# Patient Record
Sex: Female | Born: 1965 | Race: Black or African American | Hispanic: No | Marital: Married | Smoking: Never smoker
Health system: Southern US, Community
[De-identification: ages and names within clinical notes are randomized; demographics above are authoritative.]

## PROBLEM LIST (undated history)

## (undated) DIAGNOSIS — M1990 Unspecified osteoarthritis, unspecified site: Secondary | ICD-10-CM

## (undated) DIAGNOSIS — C189 Malignant neoplasm of colon, unspecified: Secondary | ICD-10-CM

## (undated) DIAGNOSIS — Z8719 Personal history of other diseases of the digestive system: Secondary | ICD-10-CM

## (undated) DIAGNOSIS — G4730 Sleep apnea, unspecified: Secondary | ICD-10-CM

## (undated) DIAGNOSIS — G5600 Carpal tunnel syndrome, unspecified upper limb: Secondary | ICD-10-CM

## (undated) DIAGNOSIS — I499 Cardiac arrhythmia, unspecified: Secondary | ICD-10-CM

## (undated) DIAGNOSIS — K219 Gastro-esophageal reflux disease without esophagitis: Secondary | ICD-10-CM

## (undated) DIAGNOSIS — IMO0002 Reserved for concepts with insufficient information to code with codable children: Secondary | ICD-10-CM

## (undated) DIAGNOSIS — I10 Essential (primary) hypertension: Secondary | ICD-10-CM

## (undated) DIAGNOSIS — I480 Paroxysmal atrial fibrillation: Secondary | ICD-10-CM

## (undated) HISTORY — PX: LASIK: SHX215

## (undated) HISTORY — PX: BACK SURGERY: SHX140

## (undated) HISTORY — DX: Sleep apnea, unspecified: G47.30

---
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
## 2001-04-06 ENCOUNTER — Encounter: Admission: RE | Admit: 2001-04-06 | Discharge: 2001-04-06 | Payer: Self-pay | Admitting: Orthopedic Surgery

## 2001-04-06 ENCOUNTER — Encounter: Payer: Self-pay | Admitting: Orthopedic Surgery

## 2002-03-30 DIAGNOSIS — C189 Malignant neoplasm of colon, unspecified: Secondary | ICD-10-CM

## 2002-03-30 HISTORY — DX: Malignant neoplasm of colon, unspecified: C18.9

## 2002-03-30 HISTORY — PX: CHOLECYSTECTOMY: SHX55

## 2002-03-30 HISTORY — PX: PORTACATH PLACEMENT: SHX2246

## 2002-12-29 HISTORY — PX: PARTIAL COLECTOMY: SHX5273

## 2003-03-31 HISTORY — PX: OTHER SURGICAL HISTORY: SHX169

## 2003-03-31 HISTORY — PX: PORT-A-CATH REMOVAL: SHX5289

## 2005-03-30 HISTORY — PX: ABDOMINAL HYSTERECTOMY: SHX81

## 2009-03-30 HISTORY — PX: CARPAL TUNNEL RELEASE: SHX101

## 2009-03-30 HISTORY — PX: LAMINECTOMY: SHX219

## 2009-07-26 ENCOUNTER — Encounter: Admission: RE | Admit: 2009-07-26 | Discharge: 2009-07-26 | Payer: Self-pay | Admitting: Neurological Surgery

## 2009-09-13 ENCOUNTER — Encounter: Admission: RE | Admit: 2009-09-13 | Discharge: 2009-09-13 | Payer: Self-pay | Admitting: Neurological Surgery

## 2009-09-15 ENCOUNTER — Encounter: Admission: RE | Admit: 2009-09-15 | Discharge: 2009-09-15 | Payer: Self-pay | Admitting: Neurological Surgery

## 2009-09-17 ENCOUNTER — Ambulatory Visit (HOSPITAL_COMMUNITY): Admission: RE | Admit: 2009-09-17 | Discharge: 2009-09-18 | Payer: Self-pay | Admitting: Neurological Surgery

## 2009-12-03 ENCOUNTER — Encounter: Admission: RE | Admit: 2009-12-03 | Discharge: 2009-12-03 | Payer: Self-pay | Admitting: Neurological Surgery

## 2010-06-15 LAB — BASIC METABOLIC PANEL
BUN: 9 mg/dL (ref 6–23)
CO2: 30 mEq/L (ref 19–32)
Calcium: 9.4 mg/dL (ref 8.4–10.5)
GFR calc Af Amer: >60 mL/min (ref >60)
GFR calc non Af Amer: >60 mL/min (ref >60)
Glucose, Bld: 79 mg/dL (ref 70–99)
Potassium: 3.6 mEq/L (ref 3.5–5.1)

## 2010-06-15 LAB — SURGICAL PCR SCREEN: Staphylococcus aureus: NEGATIVE

## 2010-06-15 LAB — DIFFERENTIAL
Basophils Relative: 0 % (ref 0–1)
Lymphs Abs: 2 10*3/uL (ref 0.7–4.0)
Neutrophils Relative %: 51 % (ref 43–77)

## 2010-06-15 LAB — CBC
MCHC: 33.3 g/dL (ref 30.0–36.0)
MCV: 82.3 fL (ref 78.0–100.0)

## 2010-06-15 LAB — PROTIME-INR: INR: 1.03 (ref 0.00–1.49)

## 2010-08-18 ENCOUNTER — Other Ambulatory Visit: Payer: Self-pay | Admitting: Neurological Surgery

## 2010-08-18 DIAGNOSIS — M545 Low back pain: Secondary | ICD-10-CM

## 2010-08-23 ENCOUNTER — Ambulatory Visit
Admission: RE | Admit: 2010-08-23 | Discharge: 2010-08-23 | Disposition: A | Discharge status: Home / Self Care | Payer: No Typology Code available for payment source | Source: Ambulatory Visit | Attending: Neurological Surgery | Admitting: Neurological Surgery

## 2010-08-23 DIAGNOSIS — M545 Low back pain: Secondary | ICD-10-CM

## 2011-04-11 IMAGING — CR DG LUMBAR SPINE 1V
1 series · 1 of 1 positions shown · non-contrast
Comparison: Lumbar myelogram CT 07/26/2009.

CLINICAL DATA: L4-L5 and L5-S1 hemilaminectomy with
microdiskectomy.

LUMBAR SPINE - 1 VIEW

[view not recorded]
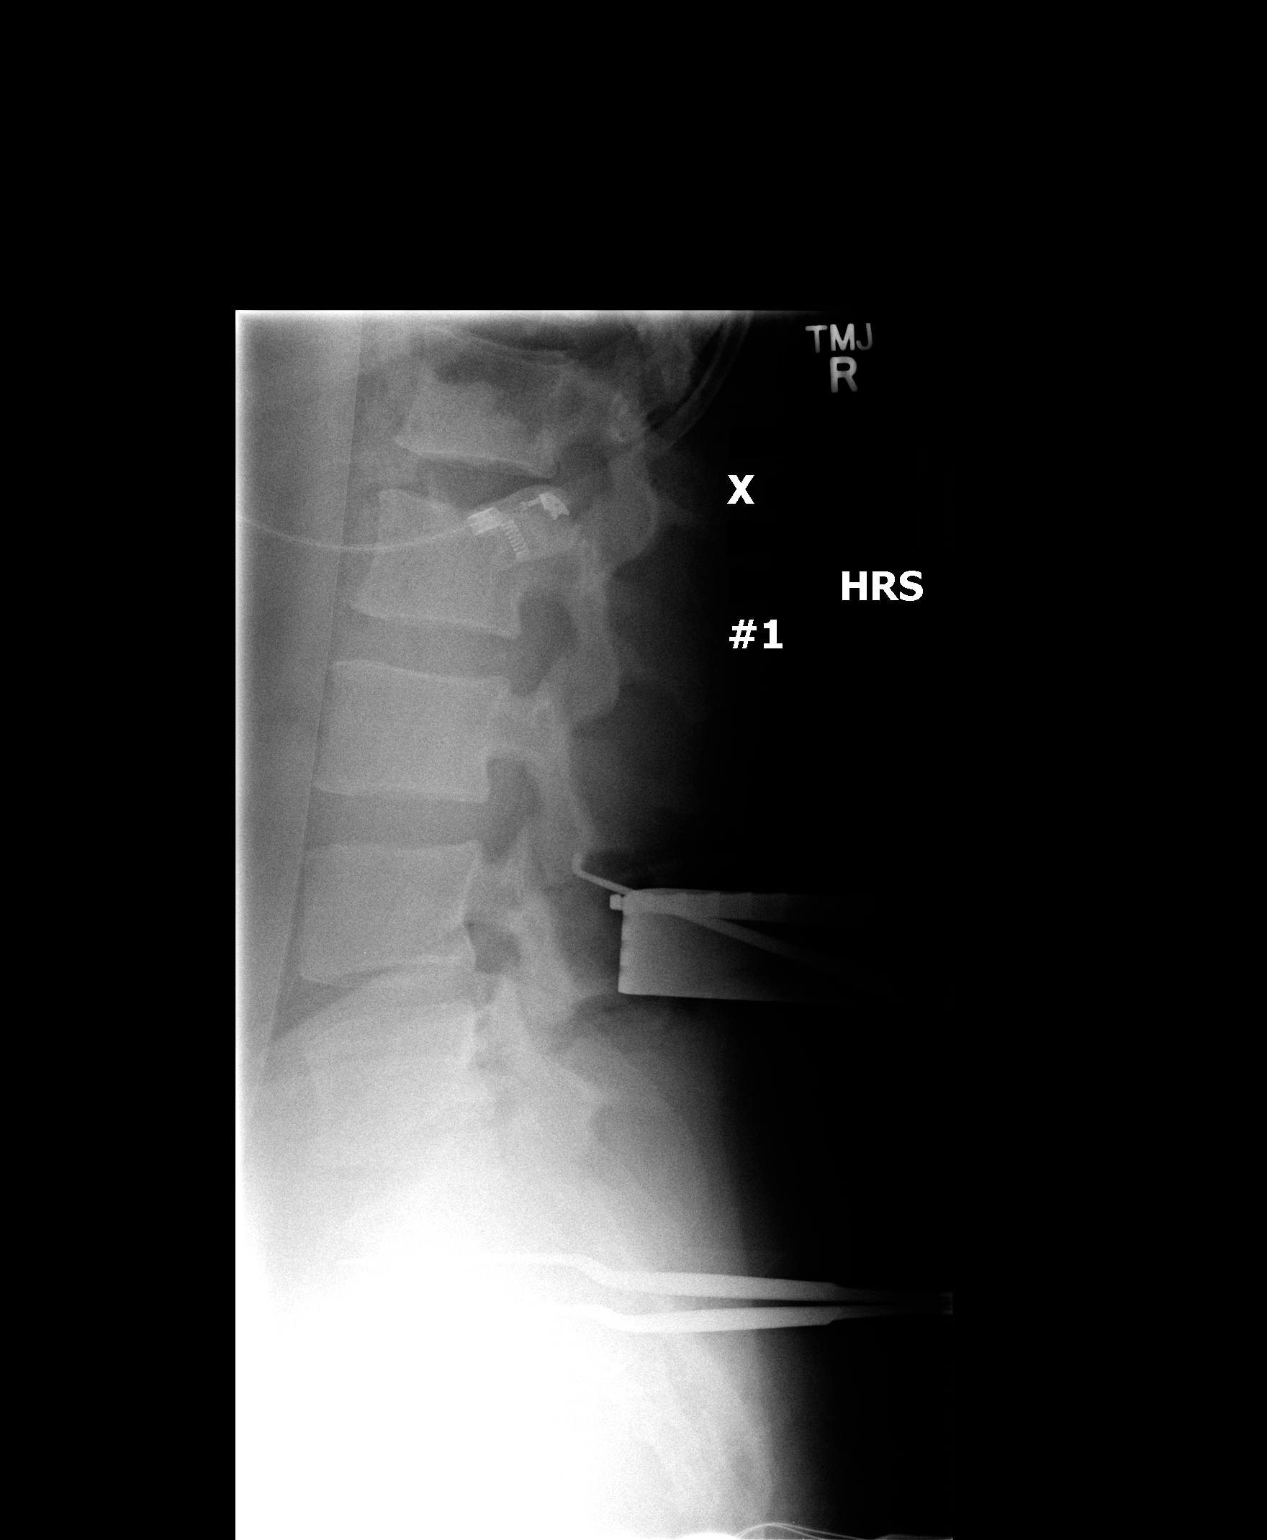

[1 of 1 positions shown; findings below may reference images not displayed]

FINDINGS: Single cross-table lateral view of the lumbar spine
labeled #1 at 7072 hours demonstrates skin spreaders posteriorly at
L4 with a surgical probe directed towards the posterior aspect of
the L3-L4 disc space.
IMPRESSION: Intraoperative localization view as described.

## 2011-06-25 ENCOUNTER — Encounter (HOSPITAL_COMMUNITY): Payer: Self-pay | Admitting: Pharmacy Technician

## 2011-06-29 ENCOUNTER — Other Ambulatory Visit (HOSPITAL_COMMUNITY): Payer: Self-pay | Admitting: *Deleted

## 2011-06-30 ENCOUNTER — Ambulatory Visit (HOSPITAL_COMMUNITY)
Admission: RE | Admit: 2011-06-30 | Discharge: 2011-06-30 | Disposition: A | Discharge status: Home / Self Care | Payer: PRIVATE HEALTH INSURANCE | Source: Ambulatory Visit | Attending: Neurological Surgery | Admitting: Neurological Surgery

## 2011-06-30 ENCOUNTER — Other Ambulatory Visit: Payer: Self-pay

## 2011-06-30 ENCOUNTER — Encounter (HOSPITAL_COMMUNITY)
Admission: RE | Admit: 2011-06-30 | Discharge: 2011-06-30 | Disposition: A | Discharge status: Home / Self Care | Payer: PRIVATE HEALTH INSURANCE | Source: Ambulatory Visit | Attending: Neurological Surgery | Admitting: Neurological Surgery

## 2011-06-30 ENCOUNTER — Encounter (HOSPITAL_COMMUNITY): Payer: Self-pay

## 2011-06-30 DIAGNOSIS — Z01818 Encounter for other preprocedural examination: Secondary | ICD-10-CM | POA: Insufficient documentation

## 2011-06-30 DIAGNOSIS — Z01810 Encounter for preprocedural cardiovascular examination: Secondary | ICD-10-CM | POA: Insufficient documentation

## 2011-06-30 DIAGNOSIS — Z01812 Encounter for preprocedural laboratory examination: Secondary | ICD-10-CM | POA: Insufficient documentation

## 2011-06-30 HISTORY — DX: Reserved for concepts with insufficient information to code with codable children: IMO0002

## 2011-06-30 HISTORY — DX: Carpal tunnel syndrome, unspecified upper limb: G56.00

## 2011-06-30 HISTORY — DX: Gastro-esophageal reflux disease without esophagitis: K21.9

## 2011-06-30 LAB — CBC
Hemoglobin: 11 g/dL — ABNORMAL LOW (ref 12.0–15.0)
MCH: 26 pg (ref 26.0–34.0)
MCHC: 32.8 g/dL (ref 30.0–36.0)
MCV: 79.2 fL (ref 78.0–100.0)
RBC: 4.23 MIL/uL (ref 3.87–5.11)

## 2011-06-30 LAB — DIFFERENTIAL
Basophils Relative: 0 % (ref 0–1)
Eosinophils Absolute: 0.2 10*3/uL (ref 0.0–0.7)
Eosinophils Relative: 2 % (ref 0–5)
Lymphs Abs: 2.9 10*3/uL (ref 0.7–4.0)
Monocytes Absolute: 0.4 10*3/uL (ref 0.1–1.0)
Monocytes Relative: 6 % (ref 3–12)

## 2011-06-30 LAB — BASIC METABOLIC PANEL
BUN: 7 mg/dL (ref 6–23)
CO2: 29 mEq/L (ref 19–32)
Glucose, Bld: 88 mg/dL (ref 70–99)
Potassium: 3.9 mEq/L (ref 3.5–5.1)
Sodium: 140 mEq/L (ref 135–145)

## 2011-06-30 LAB — SURGICAL PCR SCREEN: MRSA, PCR: NEGATIVE

## 2011-06-30 NOTE — Progress Notes (Signed)
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 Pt reports unable wear armband d/t safety regulations. Requested last office note,EKG, stess test, and echo (if ava.) from [facility].

## 2011-06-30 NOTE — Pre-Procedure Instructions (Signed)
20 [name]  06/30/2011   Your procedure is scheduled on:  April 10  Report to [facility] at 0630 AM.  Call this number if you have problems the morning of surgery: [phone]   Remember:   Do not eat food:After Midnight.  May have clear liquids: up to 4 Hours before arrival.  Clear liquids include soda, tea, black coffee, apple or grape juice, broth.  Take these medicines the morning of surgery with A SIP OF WATER: Hydrocodone, nexium   STOP Vitamin D after April 3  Do not wear jewelry, make-up or nail polish.  Do not wear lotions, powders, or perfumes. You may wear deodorant.  Do not shave 48 hours prior to surgery.  Do not bring valuables to the hospital.  Contacts, dentures or bridgework may not be worn into surgery.  Leave suitcase in the car. After surgery it may be brought to your room.  For patients admitted to the hospital, checkout time is 11:00 AM the day of discharge.   Special Instructions: CHG Shower Use Special Wash: 1/2 bottle night before surgery and 1/2 bottle morning of surgery.   Please read over the following fact sheets that you were given: Pain Booklet, Coughing and Deep Breathing, Blood Transfusion Information and Surgical Site Infection Prevention

## 2011-07-02 ENCOUNTER — Encounter (HOSPITAL_COMMUNITY): Payer: Self-pay | Admitting: *Deleted

## 2011-07-02 NOTE — Progress Notes (Signed)
Spoke with Revonda Standard, [place] regarding Cardiac history. Per Revonda Standard, PA no further review needed by anesthesia.

## 2011-07-07 MED ORDER — CEFAZOLIN SODIUM-DEXTROSE 2-3 GM-% IV SOLR
2.0000 g | INTRAVENOUS | Status: AC
  Administered 2011-07-08: 2 g via INTRAVENOUS
  Filled 2011-07-07: qty 50

## 2011-07-08 ENCOUNTER — Inpatient Hospital Stay (HOSPITAL_COMMUNITY)
Admission: RE | Admit: 2011-07-08 | Discharge: 2011-07-11 | DRG: 460 | Disposition: A | Discharge status: Home / Self Care | Payer: PRIVATE HEALTH INSURANCE | Source: Ambulatory Visit | Attending: Neurological Surgery | Admitting: Neurological Surgery

## 2011-07-08 ENCOUNTER — Ambulatory Visit (HOSPITAL_COMMUNITY): Payer: PRIVATE HEALTH INSURANCE | Admitting: Anesthesiology

## 2011-07-08 ENCOUNTER — Encounter (HOSPITAL_COMMUNITY)
Admission: RE | Disposition: A | Discharge status: Home / Self Care | Payer: Self-pay | Source: Ambulatory Visit | Attending: Neurological Surgery

## 2011-07-08 ENCOUNTER — Encounter (HOSPITAL_COMMUNITY): Payer: Self-pay | Admitting: Anesthesiology

## 2011-07-08 ENCOUNTER — Encounter (HOSPITAL_COMMUNITY): Payer: Self-pay | Admitting: *Deleted

## 2011-07-08 ENCOUNTER — Ambulatory Visit (HOSPITAL_COMMUNITY): Payer: PRIVATE HEALTH INSURANCE

## 2011-07-08 DIAGNOSIS — Z85038 Personal history of other malignant neoplasm of large intestine: Secondary | ICD-10-CM

## 2011-07-08 DIAGNOSIS — K219 Gastro-esophageal reflux disease without esophagitis: Secondary | ICD-10-CM | POA: Diagnosis present

## 2011-07-08 DIAGNOSIS — M47817 Spondylosis without myelopathy or radiculopathy, lumbosacral region: Principal | ICD-10-CM | POA: Diagnosis present

## 2011-07-08 DIAGNOSIS — Z6841 Body Mass Index (BMI) 40.0 and over, adult: Secondary | ICD-10-CM

## 2011-07-08 DIAGNOSIS — Z79899 Other long term (current) drug therapy: Secondary | ICD-10-CM

## 2011-07-08 DIAGNOSIS — I4891 Unspecified atrial fibrillation: Secondary | ICD-10-CM | POA: Diagnosis present

## 2011-07-08 HISTORY — DX: Malignant neoplasm of colon, unspecified: C18.9

## 2011-07-08 HISTORY — PX: POSTERIOR FUSION LUMBAR SPINE: SUR632

## 2011-07-08 HISTORY — DX: Personal history of other diseases of the digestive system: Z87.19

## 2011-07-08 HISTORY — DX: Paroxysmal atrial fibrillation: I48.0

## 2011-07-08 HISTORY — DX: Unspecified osteoarthritis, unspecified site: M19.90

## 2011-07-08 LAB — ABO/RH: ABO/RH(D): O POS

## 2011-07-08 LAB — TYPE AND SCREEN

## 2011-07-08 SURGERY — POSTERIOR LUMBAR FUSION 1 LEVEL
Anesthesia: General | Site: Back | Wound class: Clean

## 2011-07-08 MED ORDER — ROCURONIUM BROMIDE 100 MG/10ML IV SOLN
INTRAVENOUS | Status: DC | PRN
  Administered 2011-07-08: 50 mg via INTRAVENOUS

## 2011-07-08 MED ORDER — SODIUM CHLORIDE 0.9 % IV SOLN: 250.0000 mL | INTRAVENOUS | Status: DC

## 2011-07-08 MED ORDER — PROPOFOL 10 MG/ML IV BOLUS
INTRAVENOUS | Status: DC | PRN
  Administered 2011-07-08: 200 mg via INTRAVENOUS
  Administered 2011-07-08: 30 mg via INTRAVENOUS

## 2011-07-08 MED ORDER — DEXAMETHASONE SODIUM PHOSPHATE 10 MG/ML IJ SOLN
INTRAMUSCULAR | Status: AC
  Administered 2011-07-08: 10 mg via INTRAVENOUS
  Filled 2011-07-08: qty 1

## 2011-07-08 MED ORDER — PROMETHAZINE HCL 25 MG/ML IJ SOLN: 6.2500 mg | INTRAMUSCULAR | Status: DC | PRN

## 2011-07-08 MED ORDER — VITAMIN D3 25 MCG (1000 UNIT) PO TABS
2000.0000 [IU] | ORAL_TABLET | Freq: Every day | ORAL | Status: DC
  Administered 2011-07-08 – 2011-07-11 (×4): 2000 [IU] via ORAL
  Filled 2011-07-08 (×4): qty 2

## 2011-07-08 MED ORDER — OXYCODONE-ACETAMINOPHEN 5-325 MG PO TABS
1.0000 | ORAL_TABLET | ORAL | Status: DC | PRN
  Administered 2011-07-08 – 2011-07-11 (×10): 2 via ORAL
  Filled 2011-07-08 (×10): qty 2

## 2011-07-08 MED ORDER — PHENOL 1.4 % MT LIQD: 1.0000 | OROMUCOSAL | Status: DC | PRN

## 2011-07-08 MED ORDER — DEXAMETHASONE 4 MG PO TABS
4.0000 mg | ORAL_TABLET | Freq: Four times a day (QID) | ORAL | Status: DC
  Administered 2011-07-08 – 2011-07-11 (×11): 4 mg via ORAL
  Filled 2011-07-08 (×17): qty 1

## 2011-07-08 MED ORDER — THROMBIN 20000 UNITS EX KIT
PACK | CUTANEOUS | Status: DC | PRN
  Administered 2011-07-08: 10:00:00 via TOPICAL

## 2011-07-08 MED ORDER — ONDANSETRON HCL 4 MG/2ML IJ SOLN: 4.0000 mg | INTRAMUSCULAR | Status: DC | PRN

## 2011-07-08 MED ORDER — POTASSIUM CHLORIDE IN NACL 20-0.9 MEQ/L-% IV SOLN
INTRAVENOUS | Status: DC
  Administered 2011-07-08: 1000 mL via INTRAVENOUS
  Administered 2011-07-09: 09:00:00 via INTRAVENOUS
  Filled 2011-07-08 (×7): qty 1000

## 2011-07-08 MED ORDER — FENTANYL CITRATE 0.05 MG/ML IJ SOLN
INTRAMUSCULAR | Status: DC | PRN
  Administered 2011-07-08 (×3): 50 ug via INTRAVENOUS
  Administered 2011-07-08: 100 ug via INTRAVENOUS
  Administered 2011-07-08: 50 ug via INTRAVENOUS
  Administered 2011-07-08: 100 ug via INTRAVENOUS

## 2011-07-08 MED ORDER — ONDANSETRON HCL 4 MG/2ML IJ SOLN
INTRAMUSCULAR | Status: DC | PRN
  Administered 2011-07-08: 4 mg via INTRAVENOUS

## 2011-07-08 MED ORDER — ACETAMINOPHEN 325 MG PO TABS: 650.0000 mg | ORAL_TABLET | ORAL | Status: DC | PRN

## 2011-07-08 MED ORDER — MORPHINE SULFATE 4 MG/ML IJ SOLN
INTRAMUSCULAR | Status: AC
  Administered 2011-07-08: 4 mg via INTRAVENOUS
  Filled 2011-07-08: qty 1

## 2011-07-08 MED ORDER — CEFAZOLIN SODIUM 1-5 GM-% IV SOLN
1.0000 g | Freq: Three times a day (TID) | INTRAVENOUS | Status: AC
  Administered 2011-07-08 – 2011-07-09 (×2): 1 g via INTRAVENOUS
  Filled 2011-07-08 (×2): qty 50

## 2011-07-08 MED ORDER — VECURONIUM BROMIDE 10 MG IV SOLR
INTRAVENOUS | Status: DC | PRN
  Administered 2011-07-08: 4 mg via INTRAVENOUS

## 2011-07-08 MED ORDER — SENNA 8.6 MG PO TABS
1.0000 | ORAL_TABLET | Freq: Two times a day (BID) | ORAL | Status: DC
  Administered 2011-07-08 – 2011-07-11 (×6): 8.6 mg via ORAL
  Filled 2011-07-08 (×9): qty 1

## 2011-07-08 MED ORDER — MEPERIDINE HCL 25 MG/ML IJ SOLN: 6.2500 mg | INTRAMUSCULAR | Status: DC | PRN

## 2011-07-08 MED ORDER — ZOLPIDEM TARTRATE 10 MG PO TABS: 10.0000 mg | ORAL_TABLET | Freq: Every evening | ORAL | Status: DC | PRN

## 2011-07-08 MED ORDER — SODIUM CHLORIDE 0.9 % IR SOLN
Status: DC | PRN
  Administered 2011-07-08: 10:00:00

## 2011-07-08 MED ORDER — MIDAZOLAM HCL 2 MG/2ML IJ SOLN: 0.5000 mg | Freq: Once | INTRAMUSCULAR | Status: DC | PRN

## 2011-07-08 MED ORDER — FENTANYL CITRATE 0.05 MG/ML IJ SOLN
25.0000 ug | INTRAMUSCULAR | Status: DC | PRN
  Administered 2011-07-08 (×4): 25 ug via INTRAVENOUS

## 2011-07-08 MED ORDER — NEOSTIGMINE METHYLSULFATE 1 MG/ML IJ SOLN
INTRAMUSCULAR | Status: DC | PRN
  Administered 2011-07-08: 5 mg via INTRAVENOUS

## 2011-07-08 MED ORDER — ACETAMINOPHEN 325 MG PO TABS: 325.0000 mg | ORAL_TABLET | ORAL | Status: DC | PRN

## 2011-07-08 MED ORDER — BUPIVACAINE HCL (PF) 0.25 % IJ SOLN
INTRAMUSCULAR | Status: DC | PRN
  Administered 2011-07-08: 10 mL

## 2011-07-08 MED ORDER — SODIUM CHLORIDE 0.9 % IV SOLN
INTRAVENOUS | Status: AC
  Filled 2011-07-08: qty 500

## 2011-07-08 MED ORDER — MORPHINE SULFATE 4 MG/ML IJ SOLN
1.0000 mg | INTRAMUSCULAR | Status: DC | PRN
  Administered 2011-07-08 – 2011-07-09 (×8): 4 mg via INTRAVENOUS
  Filled 2011-07-08 (×7): qty 1

## 2011-07-08 MED ORDER — PANTOPRAZOLE SODIUM 40 MG PO TBEC
40.0000 mg | DELAYED_RELEASE_TABLET | Freq: Every day | ORAL | Status: DC
  Administered 2011-07-08 – 2011-07-11 (×4): 40 mg via ORAL
  Filled 2011-07-08 (×3): qty 1

## 2011-07-08 MED ORDER — CYCLOBENZAPRINE HCL 10 MG PO TABS
10.0000 mg | ORAL_TABLET | Freq: Three times a day (TID) | ORAL | Status: DC | PRN
  Administered 2011-07-09 (×2): 10 mg via ORAL
  Filled 2011-07-08: qty 1

## 2011-07-08 MED ORDER — GLYCOPYRROLATE 0.2 MG/ML IJ SOLN
INTRAMUSCULAR | Status: DC | PRN
  Administered 2011-07-08: 0.6 mg via INTRAVENOUS

## 2011-07-08 MED ORDER — 0.9 % SODIUM CHLORIDE (POUR BTL) OPTIME
TOPICAL | Status: DC | PRN
  Administered 2011-07-08: 1000 mL

## 2011-07-08 MED ORDER — ACETAMINOPHEN 650 MG RE SUPP: 650.0000 mg | RECTAL | Status: DC | PRN

## 2011-07-08 MED ORDER — MIDAZOLAM HCL 5 MG/5ML IJ SOLN
INTRAMUSCULAR | Status: DC | PRN
  Administered 2011-07-08: 2 mg via INTRAVENOUS

## 2011-07-08 MED ORDER — BACITRACIN 50000 UNITS IM SOLR
INTRAMUSCULAR | Status: AC
  Filled 2011-07-08: qty 1

## 2011-07-08 MED ORDER — MENTHOL 3 MG MT LOZG: 1.0000 | LOZENGE | OROMUCOSAL | Status: DC | PRN

## 2011-07-08 MED ORDER — DEXAMETHASONE SODIUM PHOSPHATE 4 MG/ML IJ SOLN
4.0000 mg | Freq: Four times a day (QID) | INTRAMUSCULAR | Status: DC
  Filled 2011-07-08 (×12): qty 1

## 2011-07-08 MED ORDER — SODIUM CHLORIDE 0.9 % IJ SOLN: 3.0000 mL | INTRAMUSCULAR | Status: DC | PRN

## 2011-07-08 MED ORDER — SODIUM CHLORIDE 0.9 % IJ SOLN
3.0000 mL | Freq: Two times a day (BID) | INTRAMUSCULAR | Status: DC
  Administered 2011-07-09 – 2011-07-10 (×3): 3 mL via INTRAVENOUS

## 2011-07-08 MED ORDER — DEXAMETHASONE SODIUM PHOSPHATE 10 MG/ML IJ SOLN: 10.0000 mg | Freq: Once | INTRAMUSCULAR | Status: DC

## 2011-07-08 MED ORDER — LACTATED RINGERS IV SOLN
INTRAVENOUS | Status: DC | PRN
  Administered 2011-07-08 (×2): via INTRAVENOUS

## 2011-07-08 SURGICAL SUPPLY — 64 items
APL SKNCLS STERI-STRIP NONHPOA (GAUZE/BANDAGES/DRESSINGS) ×1
BAG DECANTER FOR FLEXI CONT (MISCELLANEOUS) ×2 IMPLANT
BENZOIN TINCTURE PRP APPL 2/3 (GAUZE/BANDAGES/DRESSINGS) ×2 IMPLANT
BLADE SURG ROTATE 9660 (MISCELLANEOUS) IMPLANT
BUR MATCHSTICK NEURO 3.0 LAGG (BURR) ×2 IMPLANT
CANISTER SUCTION 2500CC (MISCELLANEOUS) ×2 IMPLANT
CLOTH BEACON ORANGE TIMEOUT ST (SAFETY) ×2 IMPLANT
CONT SPEC 4OZ CLIKSEAL STRL BL (MISCELLANEOUS) ×4 IMPLANT
COVER BACK TABLE 24X17X13 BIG (DRAPES) IMPLANT
COVER TABLE BACK 60X90 (DRAPES) ×2 IMPLANT
DRAPE C-ARM 42X72 X-RAY (DRAPES) ×8 IMPLANT
DRAPE LAPAROTOMY 100X72X124 (DRAPES) ×2 IMPLANT
DRAPE POUCH INSTRU U-SHP 10X18 (DRAPES) ×2 IMPLANT
DRAPE SURG 17X23 STRL (DRAPES) ×2 IMPLANT
DRESSING TELFA 8X3 (GAUZE/BANDAGES/DRESSINGS) ×2 IMPLANT
DRSG OPSITE 4X5.5 SM (GAUZE/BANDAGES/DRESSINGS) ×4 IMPLANT
DURAPREP 26ML APPLICATOR (WOUND CARE) ×2 IMPLANT
ELECT REM PT RETURN 9FT ADLT (ELECTROSURGICAL) ×2
ELECTRODE REM PT RTRN 9FT ADLT (ELECTROSURGICAL) ×1 IMPLANT
EVACUATOR 1/8 PVC DRAIN (DRAIN) ×2 IMPLANT
GAUZE SPONGE 4X4 16PLY XRAY LF (GAUZE/BANDAGES/DRESSINGS) IMPLANT
GLOVE BIO SURGEON STRL SZ8 (GLOVE) ×4 IMPLANT
GLOVE BIOGEL PI IND STRL 7.5 (GLOVE) IMPLANT
GLOVE BIOGEL PI IND STRL 8 (GLOVE) IMPLANT
GLOVE BIOGEL PI IND STRL 8.5 (GLOVE) IMPLANT
GLOVE BIOGEL PI INDICATOR 7.5 (GLOVE) ×1
GLOVE BIOGEL PI INDICATOR 8 (GLOVE) ×1
GLOVE BIOGEL PI INDICATOR 8.5 (GLOVE) ×1
GLOVE ECLIPSE 7.5 STRL STRAW (GLOVE) ×2 IMPLANT
GLOVE SURG SS PI 8.0 STRL IVOR (GLOVE) ×2 IMPLANT
GOWN BRE IMP SLV AUR LG STRL (GOWN DISPOSABLE) IMPLANT
GOWN BRE IMP SLV AUR XL STRL (GOWN DISPOSABLE) ×6 IMPLANT
GOWN STRL REIN 2XL LVL4 (GOWN DISPOSABLE) ×1 IMPLANT
HEMOSTAT POWDER KIT SURGIFOAM (HEMOSTASIS) IMPLANT
KIT BASIN OR (CUSTOM PROCEDURE TRAY) ×2 IMPLANT
KIT ROOM TURNOVER OR (KITS) ×2 IMPLANT
MILL MEDIUM DISP (BLADE) ×1 IMPLANT
MIX DBX 10CC 35% BONE (Bone Implant) ×1 IMPLANT
NDL HYPO 25X1 1.5 SAFETY (NEEDLE) ×1 IMPLANT
NEEDLE HYPO 25X1 1.5 SAFETY (NEEDLE) ×2 IMPLANT
NS IRRIG 1000ML POUR BTL (IV SOLUTION) ×2 IMPLANT
PACK LAMINECTOMY NEURO (CUSTOM PROCEDURE TRAY) ×2 IMPLANT
PAD ARMBOARD 7.5X6 YLW CONV (MISCELLANEOUS) ×6 IMPLANT
ROD EXPEDIUM PREBENT 5.5X30MM (Rod) ×1 IMPLANT
ROD EXPEDIUM PREBENT 5.5X35MM (Rod) ×1 IMPLANT
SCREW EXPEDIUM POLYAXIAL 6X40 (Screw) ×2 IMPLANT
SCREW POLY EXPEDIUM 5.5 6X30 (Screw) ×1 IMPLANT
SCREW SET SINGLE INNER (Screw) ×4 IMPLANT
SPONGE LAP 4X18 X RAY DECT (DISPOSABLE) IMPLANT
SPONGE SURGIFOAM ABS GEL 100 (HEMOSTASIS) ×2 IMPLANT
STRIP CLOSURE SKIN 1/2X4 (GAUZE/BANDAGES/DRESSINGS) ×4 IMPLANT
SUT VIC AB 0 CT1 18XCR BRD8 (SUTURE) ×1 IMPLANT
SUT VIC AB 0 CT1 8-18 (SUTURE) ×4
SUT VIC AB 2-0 CP2 18 (SUTURE) ×3 IMPLANT
SUT VIC AB 3-0 SH 8-18 (SUTURE) ×4 IMPLANT
SYR 20ML ECCENTRIC (SYRINGE) ×2 IMPLANT
Screw Expedium 5.5 6x35mm ×1 IMPLANT
TOWEL OR 17X24 6PK STRL BLUE (TOWEL DISPOSABLE) ×2 IMPLANT
TOWEL OR 17X26 10 PK STRL BLUE (TOWEL DISPOSABLE) ×2 IMPLANT
TRAP SPECIMEN MUCOUS 40CC (MISCELLANEOUS) ×1 IMPLANT
TRAY FOLEY CATH 14FRSI W/METER (CATHETERS) ×2 IMPLANT
Telamon 3deg x 26mm x 8mm ×1 IMPLANT
WATER STERILE IRR 1000ML POUR (IV SOLUTION) ×2 IMPLANT
WEDGE TANGENT 8X26MM ×1 IMPLANT

## 2011-07-08 NOTE — Preoperative (Signed)
Beta Blockers   Reason not to administer Beta Blockers:Not Applicable 

## 2011-07-08 NOTE — Anesthesia Preprocedure Evaluation (Addendum)
Anesthesia Evaluation  Patient identified by MRN, date of birth, ID band Patient awake    Reviewed: Allergy & Precautions, H&P , NPO status , Patient's Chart, lab work & pertinent test results, reviewed documented beta blocker date and time   History of Anesthesia Complications Negative for: history of anesthetic complications  Airway Mallampati: III TM Distance: >3 FB Neck ROM: full    Dental No notable dental hx. (+) Teeth Intact and Dental Advisory Given   Pulmonary neg pulmonary ROS,  breath sounds clear to auscultation  Pulmonary exam normal       Cardiovascular Exercise Tolerance: Good negative cardio ROS  + dysrhythmias Atrial Fibrillation Rhythm:regular Rate:Normal  h o of paroximall a fib   Neuro/Psych negative neurological ROS  negative psych ROS   GI/Hepatic negative GI ROS, Neg liver ROS, GERD-  ,  Endo/Other  negative endocrine ROSMorbid obesity  Renal/GU negative Renal ROS     Musculoskeletal   Abdominal   Peds  Hematology negative hematology ROS (+)   Anesthesia Other Findings   Reproductive/Obstetrics negative OB ROS                          Anesthesia Physical Anesthesia Plan  ASA: II  Anesthesia Plan: General ETT   Post-op Pain Management:    Induction: Intravenous  Airway Management Planned: Oral ETT  Additional Equipment:   Intra-op Plan:   Post-operative Plan: Extubation in OR  Informed Consent: I have reviewed the patients History and Physical, chart, labs and discussed the procedure including the risks, benefits and alternatives for the proposed anesthesia with the patient or authorized representative who has indicated his/her understanding and acceptance.   Dental Advisory Given and Dental advisory given  Plan Discussed with: CRNA, Surgeon and Anesthesiologist  Anesthesia Plan Comments:        Anesthesia Quick Evaluation

## 2011-07-08 NOTE — H&P (Signed)
Subjective: Patient is a 46 y.o. female admitted for PLIF L5-S1. Onset of symptoms was 2 yrs ago, progressive since that time.  The pain is rated moderately severe, and is located at the low back and radiates to LLE. The pain is described as aching and throbbing and occurs constantly. The symptoms have been progressive. Symptoms are exacerbated by activity. MRI or CT showed DDD/ stenosis L5-S1.   Past Medical History  Diagnosis Date  . History of colon cancer   . Herniated nucleus pulposus   . Carpal tunnel syndrome   . GERD (gastroesophageal reflux disease)   . Paroxysmal a-fib     evaluated by [facility]     Past Surgical History  Procedure Date  . Laminectomy   . Abdominal hysterectomy   . Partial colectomy   . Carpal tunnel release     bilateral  . Stomach reconstruction     d/t clot post partial colectomy  . Cholecystectomy   . Tonsillectomy   . Portacath placement   . Port-a-cath removal     Prior to Admission medications   Medication Sig Start Date End Date Taking? Authorizing Provider  cholecalciferol (VITAMIN D) 1000 UNITS tablet Take 2,000 Units by mouth daily.   Yes Historical Provider, MD  HYDROcodone-acetaminophen (NORCO) 5-325 MG per tablet Take 1 tablet by mouth 3 (three) times daily as needed. For pain   Yes Historical Provider, MD  esomeprazole (NEXIUM) 40 MG capsule Take 40 mg by mouth as needed.    Historical Provider, MD   No Known Allergies  History  Substance Use Topics  . Smoking status: Never Smoker   . Smokeless tobacco: Not on file  . Alcohol Use: No    Family History  Problem Relation Age of Onset  . Anesthesia problems Neg Hx      Review of Systems  Positive ROS: neg  All other systems have been reviewed and were otherwise negative with the exception of those mentioned in the HPI and as above.  Objective: Vital signs in last 24 hours: Temp:  [97.6 F (36.4 C)] 97.6 F (36.4 C) (04/10 0714) Pulse Rate:  [63] 63  (04/10  0714) Resp:  [16] 16  (04/10 0714) BP: (119)/(76) 119/76 mmHg (04/10 0714) SpO2:  [98 %] 98 % (04/10 0714)  General Appearance: Alert, cooperative, no distress, appears stated age Head: Normocephalic, without obvious abnormality, atraumatic Eyes: PERRL, conjunctiva/corneas clear, EOM's intact, fundi benign, both eyes      Ears: Normal TM's and external ear canals, both ears Throat: Lips, mucosa, and tongue normal; teeth and gums normal Neck: Supple, symmetrical, trachea midline, no adenopathy; thyroid: No enlargement/tenderness/nodules; no carotid bruit or JVD Back: Symmetric, no curvature, ROM normal, no CVA tenderness Lungs: Clear to auscultation bilaterally, respirations unlabored Heart: Regular rate and rhythm, S1 and S2 normal, no murmur, rub or gallop Abdomen: Soft, non-tender, bowel sounds active all four quadrants, no masses, no organomegaly Extremities: Extremities normal, atraumatic, no cyanosis or edema Pulses: 2+ and symmetric all extremities Skin: Skin color, texture, turgor normal, no rashes or lesions  NEUROLOGIC:   Mental status: Alert and oriented x4,  no aphasia, good attention span, fund of knowledge, and memory Motor Exam - grossly normal Sensory Exam - grossly normal Reflexes: ok Coordination - grossly normal Gait - grossly normal Balance - grossly normal Cranial Nerves: I: smell Not tested  II: visual acuity  OS: nl    OD: nl  II: visual fields Full to confrontation  II: pupils Equal, round,  reactive to light  III,VII: ptosis None  III,IV,VI: extraocular muscles  Full ROM  V: mastication Normal  V: facial light touch sensation  Normal  V,VII: corneal reflex  Present  VII: facial muscle function - upper  Normal  VII: facial muscle function - lower Normal  VIII: hearing Not tested  IX: soft palate elevation  Normal  IX,X: gag reflex Present  XI: trapezius strength  5/5  XI: sternocleidomastoid strength 5/5  XI: neck flexion strength  5/5  XII: tongue  strength  Normal    Data Review Lab Results  Component Value Date   WBC 7.2 06/30/2011   HGB 11.0* 06/30/2011   HCT 33.5* 06/30/2011   MCV 79.2 06/30/2011   PLT 291 06/30/2011   Lab Results  Component Value Date   NA 140 06/30/2011   K 3.9 06/30/2011   CL 103 06/30/2011   CO2 29 06/30/2011   BUN 7 06/30/2011   CREATININE 0.75 06/30/2011   GLUCOSE 88 06/30/2011   Lab Results  Component Value Date   INR 0.97 06/30/2011    Assessment/Plan: Patient admitted for PLIF L5-S1. Patient has failed conservative therapy.  I explained the condition and procedure to the patient and answered any questions.  Patient wishes to proceed with procedure as planned. Understands risks/ benefits and typical outcomes of procedure.   Christina Fuller S 07/08/2011 7:38 AM

## 2011-07-08 NOTE — Anesthesia Postprocedure Evaluation (Signed)
Anesthesia Post Note  Patient: Christina Fuller  Procedure(s) Performed: Procedure(s) (LRB): POSTERIOR LUMBAR FUSION 1 LEVEL (N/A)  Anesthesia type: GA  Patient location: PACU  Post pain: Pain level controlled  Post assessment: Post-op Vital signs reviewed  Last Vitals:  Filed Vitals:   07/08/11 1138  BP: 148/78  Pulse: 68  Temp: 36.2 C  Resp: 33    Post vital signs: Reviewed  Level of consciousness: sedated  Complications: No apparent anesthesia complications

## 2011-07-08 NOTE — Transfer of Care (Signed)
Immediate Anesthesia Transfer of Care Note  Patient: Christina Fuller  Procedure(s) Performed: Procedure(s) (LRB): POSTERIOR LUMBAR FUSION 1 LEVEL (N/A)  Patient Location: PACU  Anesthesia Type: General  Level of Consciousness: awake and alert   Airway & Oxygen Therapy: Patient Spontanous Breathing and Patient connected to face mask oxygen  Post-op Assessment: Report given to PACU RN and Post -op Vital signs reviewed and stable  Post vital signs: Reviewed and stable  Complications: No apparent anesthesia complications

## 2011-07-08 NOTE — Op Note (Signed)
07/08/2011  11:52 AM  PATIENT:  Christina Fuller  46 y.o. female  PRE-OPERATIVE DIAGNOSIS:  Severe degenerative disc disease with collapse the disc space and bi-foraminal stenosis L5-S1 with left L5 and S1 radiculopathy, status post previous decompressive hemilaminectomy L4-5 and L5-S1  POST-OPERATIVE DIAGNOSIS:  Same  PROCEDURE:   1. Decompressive lumbar laminectomy L5-S1  requiring more work than would be required of the typical PLIF procedure in order to adequately decompress the neural elements. The L5 and the S1 nerve roots were decompressed during this operation as both were symptomatic. 2. Posterior lumbar interbody fusion L5-S1 using a PEEK interbody cage packed with morcellized allograft and autograft and an allograft wedge.  3. Posterior fixation L5-S1 using Depew pedicle screws.  4. Intertransverse arthrodesis L5-S1 using morcellized autograft and allograft.  SURGEON:  Marikay Alar, MD  ASSISTANTS: Dr. Newell Coral  ANESTHESIA:  General  EBL: 150 ml  Total I/O In: 2000 [I.V.:2000] Out: 350 [Urine:200; Blood:150]  BLOOD ADMINISTERED:none  DRAINS: Hemovac   INDICATION FOR PROCEDURE: This patient had a previous decompressive hemilaminectomy at L4-5 and L5-S1 in the past. She had recurrent left leg pain. MRI showed severe degenerative disc disease L5-S1 with collapse the disc space and compression of both L5 and the S1 nerve roots. She tried medical management for well over a year without relief. I recommended a PLIF at L5-S1 to decompress both L5 and S1 nerve roots and reestablish disc space height. Patient understood the risks, benefits, and alternatives and potential outcomes and wished to proceed.  PROCEDURE DETAILS:  The patient was brought to the operating room. After induction of generalized endotracheal anesthesia the patient was rolled into the prone position on chest rolls and all pressure points were padded. The patient's lumbar region was cleaned and then prepped with  DuraPrep and draped in the usual sterile fashion. Anesthesia was injected and then a dorsal midline incision was made and carried down to the lumbosacral fascia. The fascia was opened and the paraspinous musculature was taken down in a subperiosteal fashion to expose L5-S1 and L4-5. Intraoperative fluoroscopy confirmed my level, and the spinous process was removed and complete lumbar laminectomies, hemi- facetectomies, and foraminotomies were performed at L5-S1. The yellow ligament was removed to expose the underlying dura and nerve roots, and generous foraminotomies were performed to adequately decompress the neural elements. There was significant scarring on the left-hand side and considerable care was used to dissect the dura away from the bony edges and complete hemi-facetectomy. The L5 and the S1 nerve roots were identified and decompressed distally into their respective foramina. I could then pass a coronary dilator easily along with the L5 and the S1 nerve roots once the decompression was complete. Once the decompression was complete, I turned my attention to the posterior lower lumbar interbody fusion. The epidural venous vasculature was coagulated and cut sharply. Disc space was incised and the initial discectomy was performed with pituitary rongeurs. The disc space was distracted with sequential distractors to a height of 8 mm. We then used a series of scrapers and shavers to prepare the endplates for fusion. The midline was prepared with Epstein curettes. Once the complete discectomy was finished, we packed an appropriate sized peek interbody cage with local autograft and morcellized allograft, gently retracted the nerve root, and tapped the cage into position at L5-S1. We also tapped a tangent interbody bone wedge into the opposite side. The midline was packed with morselized autograft and allograft. We then turned our attention to the posterior fixation.  The pedicle screw entry zones were identified  utilizing surface landmarks and fluoroscopy. We probed each pedicle with the pedicle probe utilizing AP and lateral fluoroscopy and tapped each pedicle with the appropriate tap. We palpated with a ball probe to assure no break in the cortex. We then placed 6-0 by 40 mm pedicle screws into the pedicles bilaterally at L5, and 6-0 by 30 mm pedicle screws into the sacrum bilaterally. We then decorticated the transverse processes and laid a mixture of morcellized autograft and allograft out over these to perform intertransverse arthrodesis at L5-S1. We then placed lordotic rods into the multiaxial screw heads of the pedicle screws and locked these in position with the locking caps and anti-torque device. We achieved compression of our grafts. We then checked our construct with AP and lateral fluoroscopy. Irrigated with copious amounts of bacitracin-containing saline solution. Placed a medium Hemovac drain through separate stab incision. Inspected the nerve roots once again to assure adequate decompression, lined to the dura with Gelfoam, and closed the muscle and the fascia with 0 Vicryl. Closed the subcutaneous tissues with 2-0 Vicryl and subcuticular tissues with 3-0 Vicryl. The skin was closed with benzoin and Steri-Strips. Dressing was then applied, the patient was awakened from general anesthesia and transported to the recovery room in stable condition. At the end of the procedure all sponge, needle and instrument counts were correct.   PLAN OF CARE: Admit to inpatient   PATIENT DISPOSITION:  PACU - hemodynamically stable.   Delay start of Pharmacological VTE agent (>24hrs) due to surgical blood loss or risk of bleeding:  yes

## 2011-07-09 LAB — CBC
Platelets: 305 10*3/uL (ref 150–400)
RBC: 4.22 MIL/uL (ref 3.87–5.11)
RDW: 14.1 % (ref 11.5–15.5)
WBC: 10.3 10*3/uL (ref 4.0–10.5)

## 2011-07-09 MED ORDER — CELECOXIB 200 MG PO CAPS
200.0000 mg | ORAL_CAPSULE | Freq: Two times a day (BID) | ORAL | Status: DC
  Administered 2011-07-09 – 2011-07-11 (×5): 200 mg via ORAL
  Filled 2011-07-09 (×8): qty 1

## 2011-07-09 NOTE — Evaluation (Signed)
Occupational Therapy Evaluation Patient Details Name: Christina Fuller MRN: [id number] DOB: [birth year] Today's Date: 07/09/2011  Problem List: There is no problem list on file for this patient.   Past Medical History:  Past Medical History  Diagnosis Date  . Herniated nucleus pulposus   . Carpal tunnel syndrome   . GERD (gastroesophageal reflux disease)   . Colon cancer 2004  . Paroxysmal atrial fibrillation     evaluated by [facility]   . H/O hiatal hernia   . Arthritis    Past Surgical History:  Past Surgical History  Procedure Date  . Laminectomy 2011    "lower back"  . Partial colectomy 12/2002    "colon cancer"  . Carpal tunnel release 2011    bilateral  . Stomach reconstruction 2005    d/t clot post partial colectomy  . Portacath placement 2004    left chest  . Port-a-cath removal 2005    left chest  . Tonsillectomy and adenoidectomy 1971  . Abdominal hysterectomy 2007  . Cholecystectomy 2004  . Posterior fusion lumbar spine 07/08/11    L5-S1  . Back surgery     OT Assessment/Plan/Recommendation OT Assessment Clinical Impression Statement: 46 yo female s/p laminectomy fusion that could benefit from skilled OT acutely. Recommend RW and 3N1. OT Recommendation Follow Up Recommendations: No OT follow up Equipment Recommended: 3 in 1 bedside comode OT Goals Acute Rehab OT Goals OT Goal Formulation: With patient Time For Goal Achievement: 2 weeks ADL Goals Pt Will Perform Lower Body Bathing: with modified independence;Sit to stand from chair;Sit to stand from bed ADL Goal: Lower Body Bathing - Progress: Goal set today Pt Will Perform Lower Body Dressing: with modified independence;Sit to stand from bed;Sit to stand from chair ADL Goal: Lower Body Dressing - Progress: Goal set today Pt Will Transfer to Toilet: with modified independence;3-in-1 ADL Goal: Toilet Transfer - Progress: Goal set today Pt Will Perform Toileting - Clothing Manipulation: with  modified independence;Sitting on 3-in-1 or toilet ADL Goal: Toileting - Clothing Manipulation - Progress: Goal set today Pt Will Perform Toileting - Hygiene: with modified independence;Sit to stand from 3-in-1/toilet ADL Goal: Toileting - Hygiene - Progress: Goal set today Miscellaneous OT Goals Miscellaneous OT Goal #1: Pt will complete bed mobility with HOB <20 degrees no bed rail as precursor to ADLS  OT Evaluation Precautions/Restrictions  Precautions Precautions: Back Precaution Booklet Issued: Yes (comment) Precaution Comments: provided back handout and provided education Required Braces or Orthoses: Spinal Brace Spinal Brace: Lumbar corset;Applied in sitting position Prior Functioning Home Living Lives With: Spouse Available Help at Discharge: Family;Available PRN/intermittently (daughter) Type of Home: House Home Access: Stairs to enter Secretary/administrator of Steps: 3 Entrance Stairs-Rails: Left Home Layout: One level Bathroom Shower/Tub: Health visitor: Standard Home Adaptive Equipment: None Prior Function Level of Independence: Independent Able to Take Stairs?: Reciprically Driving: Yes (drove last 07/07/79) 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) Comments: has 3 months off for surgery  ADL ADL Eating/Feeding: Performed;Set up Where Assessed - Eating/Feeding: Chair Grooming: Performed;Wash/dry face;Teeth care;Set up Where Assessed - Grooming: Sitting, chair;Supported Location manager Dressing: Performed;Supervision/safety Where Assessed - Upper Body Dressing: Sitting, bed;Unsupported;Other (comment) (don brace and provided additional education) Toilet Transfer: Simulated;Other (comment) (min guard (A)) Toilet Transfer Method: Proofreader: Raised toilet seat with arms (or 3-in-1 over toilet) Equipment Used: Rolling walker Ambulation Related to ADLs: Pt ambulating ~150 with Min guard (A)  decreased gait velocity and required x3  rest breaks ADL Comments: Pt educated on back precautions with adls and provided hand out.  Vision/Perception  Vision - History Baseline Vision: No visual deficits Patient Visual Report: No change from baseline Cognition Cognition Arousal/Alertness: Awake/alert Overall Cognitive Status: Appears within functional limits for tasks assessed Orientation Level: Oriented X4 Sensation/Coordination Coordination Gross Motor Movements are Fluid and Coordinated: Yes Fine Motor Movements are Fluid and Coordinated: Yes Extremity Assessment RUE Assessment RUE Assessment: Within Functional Limits (grossly) LUE Assessment LUE Assessment: Within Functional Limits (grossly) Mobility  Bed Mobility Bed Mobility: Yes Right Sidelying to Sit: 5: Supervision;With rails;HOB elevated (comment degrees) (min v/c) Right Sidelying to Sit Details (indicate cue type and reason): v/c for sequencing with precautions Supine to Sit: 5: Supervision;With rails;HOB elevated (Comment degrees) Sitting - Scoot to Edge of Bed: 5: Supervision;With rail Transfers Transfers: Yes Sit to Stand: From bed;With upper extremity assist;Other (comment) (min guard with v/c for hand placement) Stand to Sit: 4: Min assist;With upper extremity assist;To chair/3-in-1 Exercises   End of Session OT - End of Session Equipment Utilized During Treatment: Gait belt;Back brace Activity Tolerance: Patient tolerated treatment well Patient left: in chair;with call bell in reach (educated OOB to chair ~45 minutes at a time) Nurse Communication: Mobility status for transfers;Mobility status for ambulation General Behavior During Session: [facility] for tasks performed Cognition: [facility] for tasks performed   Lucile Shutters 07/09/2011, 2:13 PM  Pager: [phone]

## 2011-07-09 NOTE — Progress Notes (Signed)
Patient ID: Christina Fuller, female   DOB: 09-04-1965, 46 y.o.   MRN: [id number] Patient seems to be doing well this morning. He has appropriate postoperative back soreness with improvement in her preoperative leg pain. She does have some numbness and tingling in the left foot, she has good dorsi and plantarflexion on exam. she looks comfortable. We will DC the Foley and mobilize her today with therapy and continue her pain control.

## 2011-07-09 NOTE — Progress Notes (Signed)
Patient Christina Fuller. Albea, 46 year old [place] American female is recovering from surgery.  She enjoys the support of her husband and 4 children.  Patient looks forward to going home in a couple of days.  She expresses a strong testimony of overcoming cancer; and submits that God will get her through her latest challenges.  Patient thanked Orthoptist for providing pastoral presence, prayer, and conversation.  I will follow-up as needed.

## 2011-07-09 NOTE — Progress Notes (Signed)
UR COMPLETED  

## 2011-07-09 NOTE — Evaluation (Signed)
Physical Therapy Evaluation Patient Details Name: Christina Fuller MRN: [id number] DOB: 03/23/1966 Today's Date: 07/09/2011  Problem List: There is no problem list on file for this patient.   Past Medical History:  Past Medical History  Diagnosis Date  . Herniated nucleus pulposus   . Carpal tunnel syndrome   . GERD (gastroesophageal reflux disease)   . Colon cancer 2004  . Paroxysmal atrial fibrillation     evaluated by [facility]   . H/O hiatal hernia   . Arthritis    Past Surgical History:  Past Surgical History  Procedure Date  . Laminectomy 2011    "lower back"  . Partial colectomy 12/2002    "colon cancer"  . Carpal tunnel release 2011    bilateral  . Stomach reconstruction 2005    d/t clot post partial colectomy  . Portacath placement 2004    left chest  . Port-a-cath removal 2005    left chest  . Tonsillectomy and adenoidectomy 1971  . Abdominal hysterectomy 2007  . Cholecystectomy 2004  . Posterior fusion lumbar spine 07/08/11    L5-S1  . Back surgery     PT Assessment/Plan/Recommendation PT Assessment Clinical Impression Statement: 46 y.o. female admitted to [facility] for PLIF L5-S1.  She presents today with increased surgical pain, decreased mobility, decreased activity tolerance and endurance, decreased knowledge of back precuations and brace use and generalized weakness.  She would benefit from acute PT to maximize her independence, functional mobility and safety so that she may return home safely with family's assist at discharge.   PT Recommendation/Assessment: Patient will need skilled PT in the acute care venue PT Problem List: Decreased strength;Decreased activity tolerance;Decreased mobility;Decreased knowledge of use of DME;Decreased knowledge of precautions;Pain PT Therapy Diagnosis : Difficulty walking;Abnormality of gait;Generalized weakness;Acute pain PT Plan PT Frequency: Min 5X/week PT Treatment/Interventions: DME instruction;Gait  training;Stair training;Therapeutic activities;Functional mobility training;Therapeutic exercise;Neuromuscular re-education;Balance training;Patient/family education PT Recommendation Follow Up Recommendations: No PT follow up Equipment Recommended: Rolling walker with 5" wheels PT Goals  Acute Rehab PT Goals PT Goal Formulation: With patient Time For Goal Achievement: 7 days Pt will Roll Supine to Right Side: with modified independence PT Goal: Rolling Supine to Right Side - Progress: Goal set today Pt will Roll Supine to Left Side: with modified independence PT Goal: Rolling Supine to Left Side - Progress: Goal set today Pt will go Supine/Side to Sit: with modified independence PT Goal: Supine/Side to Sit - Progress: Goal set today Pt will go Sit to Supine/Side: with modified independence;with HOB 0 degrees PT Goal: Sit to Supine/Side - Progress: Goal set today Pt will go Sit to Stand: with modified independence PT Goal: Sit to Stand - Progress: Goal set today Pt will go Stand to Sit: with modified independence PT Goal: Stand to Sit - Progress: Goal set today Pt will Transfer Bed to Chair/Chair to Bed: with modified independence PT Transfer Goal: Bed to Chair/Chair to Bed - Progress: Goal set today Pt will Ambulate: >150 feet;with modified independence;with rolling walker PT Goal: Ambulate - Progress: Goal set today Pt will Go Up / Down Stairs: 3-5 stairs;with rail(s);with supervision (left railing) PT Goal: Up/Down Stairs - Progress: Goal set today Additional Goals Additional Goal #1: The patient will report and demonstrate correct compliance of 3/3 back precautions independently.   PT Goal: Additional Goal #1 - Progress: Goal set today  PT Evaluation Precautions/Restrictions  Precautions Precautions: Back Precaution Booklet Issued: Yes (comment) Precaution Comments: provided back handout and provided education Required Braces or  Orthoses: Spinal Brace Spinal Brace: Lumbar  corset;Applied in sitting position Prior Functioning  Home Living Lives With: Spouse Available Help at Discharge: Family;Available PRN/intermittently (daughter) Type of Home: House Home Access: Stairs to enter Secretary/administrator of Steps: 3 Entrance Stairs-Rails: Left Home Layout: One level Bathroom Shower/Tub: Health visitor: Standard Home Adaptive Equipment: None Prior Function Level of Independence: Independent Able to Take Stairs?: Reciprically Driving: Yes (drove last 1/61/09) 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   Coordination Gross Motor Movements are Fluid and Coordinated: Yes Fine Motor Movements are Fluid and Coordinated: Yes Extremity Assessment RUE Assessment RUE Assessment: Within Functional Limits (grossly) LUE Assessment LUE Assessment: Within Functional Limits (grossly) RLE Assessment RLE Assessment: Within Functional Limits (grossly) LLE Assessment LLE Assessment: Within Functional Limits (grossly, per patient: doesn't feel like left leg is weaker ) Mobility (including Balance) Bed Mobility Bed Mobility: Yes Right Sidelying to Sit: 5: Supervision;With rails;HOB elevated (comment degrees) (HOB 30 degrees) Right Sidelying to Sit Details (indicate cue type and reason): verbal cues for sequencing and hand placment. The patient was already on her right side when starting bed mobility, so only verbally reviewed log roll technique.   Supine to Sit: 5: Supervision;With rails;HOB elevated (Comment  degrees) Sitting - Scoot to Edge of Bed: 5: Supervision;With rail Sitting - Scoot to Edge of Bed Details (indicate cue type and reason): supervision for safety and cueing to avoid twisting.   Transfers Transfers: Yes Sit to Stand: 5: Supervision;With upper extremity assist;From elevated surface;From bed;With armrests Sit to Stand Details (indicate cue type and reason): supervision for safety, verbal cues for safe hand placement.   Stand to Sit: 4: Min assist;To chair/3-in-1;With armrests;With upper extremity assist Stand to Sit Details: min assist to help patient lower slowly to lower sitting surface.  Verbal cues for safe hand placement.   Ambulation/Gait Ambulation/Gait: Yes Ambulation/Gait Assistance: 5: Supervision Ambulation/Gait Assistance Details (indicate cue type and reason): verbal cues for upright posture Ambulation Distance (Feet): 150 Feet Assistive device: Rolling walker Gait Pattern: Step-through pattern;Shuffle;Trunk flexed Gait velocity: 0.30 ft/sec (<1.8 ft/sec puts the patient at risk for recurrent falls)    End of Session PT - End of Session Equipment Utilized During Treatment: Back brace Activity Tolerance: Patient limited by pain Patient left: in chair;with call bell in reach General Behavior During Session: [facility] for tasks performed Cognition: [facility] for tasks performed Patient Class: Inpatient   Najee Manninen B. Christel Bai, PT, DPT [phone] 07/09/2011, 4:27 PM

## 2011-07-10 MED ORDER — BISACODYL 5 MG PO TBEC
5.0000 mg | DELAYED_RELEASE_TABLET | Freq: Every day | ORAL | Status: DC | PRN
  Administered 2011-07-10: 5 mg via ORAL
  Filled 2011-07-10: qty 1

## 2011-07-10 MED ORDER — POLYETHYLENE GLYCOL 3350 17 G PO PACK
17.0000 g | PACK | Freq: Every day | ORAL | Status: DC
  Administered 2011-07-10 – 2011-07-11 (×2): 17 g via ORAL
  Filled 2011-07-10 (×2): qty 1

## 2011-07-10 MED FILL — Heparin Sodium (Porcine) Inj 1000 Unit/ML: INTRAMUSCULAR | Qty: 30 | Status: AC

## 2011-07-10 MED FILL — Sodium Chloride IV Soln 0.9%: INTRAVENOUS | Qty: 1000 | Status: AC

## 2011-07-10 NOTE — Progress Notes (Signed)
Physical Therapy Treatment Patient Details Name: ELZABETH MCQUERRY MRN: [id number] DOB: [birth year] Today's Date: 07/10/2011  PT Assessment/Plan  PT - Assessment/Plan Comments on Treatment Session: Pt was able to increase activity with an decrease in pain. Pt amb wel with no LOB or unsteadiness. Pt requested hospital bed and 3 in1 at home, will follow up next visit to see if still appropriate. Attempt stairs on next visit. Will need to attempt stairs next session. PT Plan: Discharge plan remains appropriate;Frequency remains appropriate PT Frequency: Min 5X/week Follow Up Recommendations: No PT follow up Equipment Recommended: Rolling walker with 5" wheels;3 in 1 bedside comode PT Goals  Acute Rehab PT Goals PT Goal: Rolling Supine to Right Side - Progress: Progressing toward goal PT Goal: Supine/Side to Sit - Progress: Progressing toward goal PT Goal: Sit to Stand - Progress: Progressing toward goal PT Goal: Ambulate - Progress: Progressing toward goal  PT Treatment Precautions/Restrictions  Precautions Precautions: Fall;Back Precaution Booklet Issued: Yes (comment) Precaution Comments: Pt was able to recall 1/3 back percaustions. Pt was educated on 3/3 back percaustions. Required Braces or Orthoses: Spinal Brace Spinal Brace: Thoracolumbosacral orthotic;Applied in sitting position Restrictions Weight Bearing Restrictions: No Mobility (including Balance) Bed Mobility Right Sidelying to Sit: 5: Supervision;HOB flat;With rails Supine to Sit: 5: Supervision;With rails;HOB flat Sitting - Scoot to Edge of Bed: 5: Supervision Transfers Transfers: Yes Sit to Stand: 5: Supervision;With upper extremity assist;From bed Sit to Stand Details (indicate cue type and reason): Pt required VC for hand placement on bed during ascent  Stand to Sit: 5: Supervision;To bed;To chair/3-in-1;With upper extremity assist;With armrests Stand to Sit Details: Pt required VC for hand placement on armrest  during descent  Ambulation/Gait Ambulation/Gait: Yes Ambulation/Gait Assistance: 5: Supervision Ambulation/Gait Assistance Details (indicate cue type and reason): Pt required VC to prevent twisting during amb.   Ambulation Distance (Feet): 500 Feet Assistive device: Rolling walker Gait Pattern: Step-through pattern;Decreased stride length Stairs: No Wheelchair Mobility Wheelchair Mobility: No   End of Session PT - End of Session Equipment Utilized During Treatment: Gait belt Activity Tolerance: Patient tolerated treatment well Patient left: in chair;with call bell in reach Nurse Communication: Mobility status for transfers;Mobility status for ambulation General Behavior During Session: [facility] for tasks performed Cognition: [facility] for tasks performed  Judith Blonder, Leda Gauze 07/10/2011, 12:23 PM 07/10/2011 Fredrich Birks PTA [phone] pager [phone] office

## 2011-07-10 NOTE — Progress Notes (Signed)
Patient ID: Christina Fuller, female   DOB: March 04, 1966, 46 y.o.   MRN: [id number] Subjective: Patient reports approp back pain with mobilization, no leg pain, some N/T in L foot, no weakness  Objective: Vital signs in last 24 hours: Temp:  [97.5 F (36.4 C)-98.1 F (36.7 C)] 97.9 F (36.6 C) (04/12 0542) Pulse Rate:  [56-76] 76  (04/12 0542) Resp:  [16-20] 18  (04/12 0542) BP: (113-134)/(68-77) 132/77 mmHg (04/12 0542) SpO2:  [92 %-96 %] 94 % (04/12 0542)  Intake/Output from previous day: 04/11 0701 - 04/12 0700 In: 3652.5 [P.O.:2640; I.V.:1012.5] Out: 360 [Urine:350; Drains:10] Intake/Output this shift:    Neurologic: Grossly normal  Lab Results: Lab Results  Component Value Date   WBC 10.3 07/09/2011   HGB 10.9* 07/09/2011   HCT 33.2* 07/09/2011   MCV 78.7 07/09/2011   PLT 305 07/09/2011   Lab Results  Component Value Date   INR 0.97 06/30/2011   BMET Lab Results  Component Value Date   NA 140 06/30/2011   K 3.9 06/30/2011   CL 103 06/30/2011   CO2 29 06/30/2011   GLUCOSE 88 06/30/2011   BUN 7 06/30/2011   CREATININE 0.75 06/30/2011   CALCIUM 9.2 06/30/2011    Studies/Results: Dg Lumbar Spine 2-3 Views  07/08/2011  *RADIOLOGY REPORT*  Clinical Data: 46 year old female undergoing lumbar surgery.  LUMBAR SPINE - 2-3 VIEW  Comparison: Lumbar MRI 08/23/2010 and earlier.  Fluoroscopy time of 1.5 minutes was utilized.  Findings: Two intraoperative fluoroscopic spot views of the lower lumbar spine demonstrate interbody and transpedicular fusion hardware placement at L5-S1.  IMPRESSION: Posterior and interbody fusion hardware placed at L5-S1.  Original Report Authenticated By: Harley Hallmark, M.D.    Assessment/Plan: Doing well, likely home tomorrow   LOS: 2 days    Deshondra Worst S 07/10/2011, 7:41 AM

## 2011-07-11 MED ORDER — OXYCODONE-ACETAMINOPHEN 5-325 MG PO TABS: 1.0000 | ORAL_TABLET | ORAL | Status: AC | PRN

## 2011-07-11 NOTE — Progress Notes (Signed)
   CARE MANAGEMENT NOTE 07/11/2011  Patient:  KAMDEN, REBER   Account Number:  [id number]  Date Initiated:  07/11/2011  Documentation initiated by:  [facility]  Subjective/Objective Assessment:   Lumbar degenerative disc disease, lumbar spondylosis, lumbar stenosis     Action/Plan:   Anticipated DC Date:  07/11/2011   Anticipated DC Plan:  HOME/SELF CARE      DC Planning Services  CM consult      Choice offered to / List presented to:     DME arranged  3-N-1  Levan Hurst      DME agency  [facility]        Status of service:  Completed, signed off Medicare Important Message given?   (If response is "NO", the following Medicare IM given date fields will be blank) Date Medicare IM given:   Date Additional Medicare IM given:    Discharge Disposition:  HOME/SELF CARE  Per UR Regulation:    If discussed at Long Length of Stay Meetings, dates discussed:    Comments:  07/11/2011 1300 Contacted [facility] for DME for scheduled d/c today. Isidoro Donning RN CCM Case Mgmt phone [phone]

## 2011-07-11 NOTE — Progress Notes (Signed)
Dc papers discussed and signed. 3 in 1 and rolling walker and hospital bed arranged by social work. Rx given to pt. Pt had no iv in at this time. Pt left via wheelchair with family.

## 2011-07-11 NOTE — Progress Notes (Signed)
Contacted [facility] for hospital bed for home for scheduled d/c today. Isidoro Donning RN CCM Case Mgmt phone [phone]

## 2011-07-11 NOTE — Progress Notes (Signed)
Physical Therapy Treatment Patient Details Name: Christina Fuller MRN: [id number] DOB: 1965-09-07 Today's Date: 07/11/2011  PT Assessment/Plan  PT - Assessment/Plan Comments on Treatment Session: 46 y.o. female admitted to [facility] for PLIF L5-S1.  She is progressing well with her mobility and still relies heavily on RW for external support while walking. She has a very low commode at home and a very low bed at home and would benefit from RW, 3-in-1 and hospital bed (rental).   PT Plan: Discharge plan remains appropriate;Frequency remains appropriate PT Frequency: Min 5X/week Follow Up Recommendations: No PT follow up Equipment Recommended: Rolling walker with 5" wheels;3 in 1 bedside comode;Other (comment) (hospital bed rental) PT Goals  Acute Rehab PT Goals PT Goal: Sit to Stand - Progress: Met PT Goal: Stand to Sit - Progress: Met PT Goal: Ambulate - Progress: Progressing toward goal PT Goal: Up/Down Stairs - Progress: Met Additional Goals PT Goal: Additional Goal #1 - Progress: Progressing toward goal  PT Treatment Precautions/Restrictions  Precautions Precautions: Back Precaution Booklet Issued: Yes (comment) Precaution Comments: Reviewed back precautions. She was able to report 2/3 independently.   Required Braces or Orthoses: Spinal Brace Spinal Brace: Lumbar corset;Applied in sitting position Restrictions Weight Bearing Restrictions: No Mobility (including Balance) Transfers Sit to Stand: 6: Modified independent (Device/Increase time);With upper extremity assist;With armrests;From chair/3-in-1 Stand to Sit: 6: Modified independent (Device/Increase time);With upper extremity assist;With armrests;To chair/3-in-1 Ambulation/Gait Ambulation/Gait Assistance: 5: Supervision Ambulation/Gait Assistance Details (indicate cue type and reason): supervision for safety due to slow gait speed Ambulation Distance (Feet): 400 Feet Assistive device: Rolling walker Gait Pattern: Step-through  pattern Stairs: Yes Stairs Assistance: 5: Supervision Stair Management Technique: Two rails;Alternating pattern;Forwards Number of Stairs: 10  Height of Stairs: 4  (4-6" steps in 3000 gym)    End of Session PT - End of Session Equipment Utilized During Treatment: Back brace (RW) Activity Tolerance: Patient limited by fatigue;Patient limited by pain Patient left: in chair;with call bell in reach Nurse Communication:  (need for DME) General Behavior During Session: [facility] for tasks performed Cognition: [facility] for tasks performed  Hashim Eichhorst B. Monesha Monreal, PT, DPT [phone] 07/11/2011, 11:57 AM

## 2011-07-11 NOTE — Discharge Summary (Signed)
Physician Discharge Summary  Patient ID: Christina Fuller MRN: [id number] DOB/AGE: 12-10-1965 47 y.o.  Admit date: 07/08/2011 Discharge date: 07/11/2011  Admission Diagnoses: Lumbar degenerative disc disease, lumbar spondylosis, lumbar stenosis  Discharge Diagnoses: Lumbar degenerative disc disease, lumbar spondylosis, lumbar stenosis  Discharged Condition: good  Hospital Course: Patient was admitted by Dr. Marikay Alar who performed L5-S1 lumbar decompression and arthrodesis. Patient made good progress postoperatively. She is up and ambulating. She is asking to be discharged to home. The wound is clean and dry. Her wound drain was removed. She's been given instructions regarding wound care and activities. She is to return for followup with Dr. Yetta Barre in 2-3 weeks.  Discharge Exam: Blood pressure 147/82, pulse 67, temperature 97.7 F (36.5 C), temperature source Oral, resp. rate 20, height 5\' 6"  (1.676 m), weight 113.6 kg (250 lb 7.1 oz), SpO2 95.00%.  Disposition: Home   Medication List  As of 07/11/2011  8:51 AM   TAKE these medications         cholecalciferol 1000 UNITS tablet   Commonly known as: VITAMIN D   Take 2,000 Units by mouth daily.      esomeprazole 40 MG capsule   Commonly known as: NEXIUM   Take 40 mg by mouth as needed.      HYDROcodone-acetaminophen 5-325 MG per tablet   Commonly known as: NORCO   Take 1 tablet by mouth 3 (three) times daily as needed. For pain      oxyCODONE-acetaminophen 5-325 MG per tablet   Commonly known as: PERCOCET   Take 1-2 tablets by mouth every 4 (four) hours as needed for pain.             Signed: Hewitt Shorts, MD 07/11/2011, 8:51 AM

## 2013-03-30 HISTORY — PX: JOINT REPLACEMENT: SHX530

## 2019-04-11 NOTE — H&P (Signed)
TOTAL KNEE ADMISSION H&P  Patient is being admitted for left total knee arthroplasty.  Subjective:  Chief Complaint:left knee pain.  HPI: Christina Fuller, 54 y.o. female, has a history of pain and functional disability in the left knee due to arthritis and has failed non-surgical conservative treatments for greater than 12 weeks to includeNSAID's and/or analgesics, corticosteriod injections, viscosupplementation injections, flexibility and strengthening excercises, weight reduction as appropriate and activity modification.  Onset of symptoms was gradual, starting 5 years ago with gradually worsening course since that time. The patient noted prior procedures on the knee to include  arthroscopy and menisectomy on the left knee(s).  Patient currently rates pain in the left knee(s) at 5 out of 10 with activity. Patient has night pain, worsening of pain with activity and weight bearing, pain that interferes with activities of daily living and crepitus.  Patient has evidence of subchondral sclerosis, periarticular osteophytes and joint space narrowing by imaging studies. This patient has had no previous injury. There is no active infection.  There are no problems to display for this patient.  Past Medical History:  Diagnosis Date  . Arthritis   . Carpal tunnel syndrome   . Colon cancer 2004  . GERD (gastroesophageal reflux disease)   . H/O hiatal hernia   . Herniated nucleus pulposus   . Paroxysmal atrial fibrillation    evaluated by [facility]     Past Surgical History:  Procedure Laterality Date  . ABDOMINAL HYSTERECTOMY  2007  . BACK SURGERY    . CARPAL TUNNEL RELEASE  2011   bilateral  . CHOLECYSTECTOMY  2004  . LAMINECTOMY  2011   "lower back"  . PARTIAL COLECTOMY  12/2002   "colon cancer"  . PORT-A-CATH REMOVAL  2005   left chest  . PORTACATH PLACEMENT  2004   left chest  . POSTERIOR FUSION LUMBAR SPINE  07/08/11   L5-S1  . stomach reconstruction  2005   d/t clot  post partial colectomy  . TONSILLECTOMY AND ADENOIDECTOMY  1971    No current facility-administered medications for this encounter.   Current Outpatient Medications  Medication Sig Dispense Refill Last Dose  . cholecalciferol (VITAMIN D) 1000 UNITS tablet Take 2,000 Units by mouth daily.     Marland Kitchen esomeprazole (NEXIUM) 40 MG capsule Take 40 mg by mouth as needed.     Marland Kitchen HYDROcodone-acetaminophen (NORCO) 5-325 MG per tablet Take 1 tablet by mouth 3 (three) times daily as needed. For pain      No Known Allergies  Social History   Tobacco Use  . Smoking status: Never Smoker  . Smokeless tobacco: Never Used  Substance Use Topics  . Alcohol use: No    Family History  Problem Relation Age of Onset  . Anesthesia problems Neg Hx      Review of Systems  Constitutional: Negative.   HENT: Negative.   Eyes: Negative.   Respiratory: Negative.   Cardiovascular: Negative.   Gastrointestinal: Negative.   Endocrine: Negative.   Genitourinary: Negative.   Musculoskeletal: Positive for arthralgias, gait problem, joint swelling and myalgias.  Skin: Negative.   Allergic/Immunologic: Negative.   Hematological: Negative.   Psychiatric/Behavioral: Negative.   All other systems reviewed and are negative.   Objective:  Physical Exam  Constitutional: She is oriented to person, place, and time. She appears well-developed and well-nourished.  HENT:  Head: Normocephalic and atraumatic.  Cardiovascular: Normal rate, regular rhythm and normal heart sounds.  Respiratory: Effort normal.  Musculoskeletal:  Cervical back: Normal range of motion and neck supple.     Comments: Pain with tenderness on lateral and medial joint line ROM full 5/5 strength with resisted flexion and extension Calf supple  Neurological: She is alert and oriented to person, place, and time.  Skin: Skin is warm and dry.  Psychiatric: She has a normal mood and affect. Her behavior is normal. Judgment and thought content  normal.    Vital signs in last 24 hours: BP: ()/()  Arterial Line BP: ()/()   Labs:   Estimated body mass index is 40.42 kg/m as calculated from the following:   Height as of 07/08/11: 5\' 6"  (1.676 m).   Weight as of 07/08/11: 113.6 kg.   Imaging Review Plain radiographs demonstrate moderate degenerative joint disease of the left knee(s). The overall alignment ismild valgus. The bone quality appears to be fair for age and reported activity level.      Assessment/Plan:  End stage arthritis, left knee   The patient history, physical examination, clinical judgment of the provider and imaging studies are consistent with end stage degenerative joint disease of the left knee(s) and total knee arthroplasty is deemed medically necessary. The treatment options including medical management, injection therapy arthroscopy and arthroplasty were discussed at length. The risks and benefits of total knee arthroplasty were presented and reviewed. The risks due to aseptic loosening, infection, stiffness, patella tracking problems, thromboembolic complications and other imponderables were discussed. The patient acknowledged the explanation, agreed to proceed with the plan and consent was signed. Patient is being admitted for inpatient treatment for surgery, pain control, PT, OT, prophylactic antibiotics, VTE prophylaxis, progressive ambulation and ADL's and discharge planning. The patient is planning to be discharged home with home health services after 1 night stay in the hospital.

## 2019-04-19 ENCOUNTER — Other Ambulatory Visit (HOSPITAL_COMMUNITY)
Admission: RE | Admit: 2019-04-19 | Discharge: 2019-04-19 | Disposition: A | Discharge status: Home / Self Care | Payer: BC Managed Care – PPO | Source: Ambulatory Visit | Attending: Specialist | Admitting: Specialist

## 2019-04-19 DIAGNOSIS — Z01812 Encounter for preprocedural laboratory examination: Secondary | ICD-10-CM | POA: Diagnosis present

## 2019-04-19 DIAGNOSIS — Z20822 Contact with and (suspected) exposure to covid-19: Secondary | ICD-10-CM | POA: Insufficient documentation

## 2019-04-19 LAB — SARS CORONAVIRUS 2 (TAT 6-24 HRS): SARS Coronavirus 2: NEGATIVE

## 2019-04-19 NOTE — Progress Notes (Addendum)
PCP - Carolin Coy, with surgical clearance dated 04/10/19  Cardiologist - Asif Wahid (See 01-30-19 office note)  Chest x-ray -   EKG - 01-30-19 (Chart everywhere)  Stress Test -  ECHO -  Cardiac Cath -   Sleep Study -  CPAP -   Fasting Blood Sugar -  Checks Blood Sugar _____ times a day  Blood Thinner Instructions: Eliquis Stop 2 days prior to surgery Aspirin Instructions: Last Dose:  Anesthesia review:   Patient denies shortness of breath, fever, cough and chest pain at PAT appointment   Patient verbalized understanding of instructions that were given to them at the PAT appointment. Patient was also instructed that they will need to review over the PAT instructions again at home before surgery.

## 2019-04-19 NOTE — Patient Instructions (Addendum)
DUE TO COVID-19 ONLY ONE VISITOR IS ALLOWED TO COME WITH YOU AND STAY IN THE WAITING ROOM ONLY DURING PRE OP AND PROCEDURE DAY OF SURGERY. THE 1 VISITOR MAY VISIT WITH YOU AFTER SURGERY IN YOUR PRIVATE ROOM DURING VISITING HOURS ONLY!  YOU NEED TO HAVE A COVID 19 TEST ON 04-19-19 PLEASE CONTINUE THE QUARANTINE INSTRUCTIONS AS OUTLINED IN YOUR HANDOUT.                Christina Fuller  04/19/2019   Your procedure is scheduled on: 04-21-19   Report to [facility] Main  Entrance    Report to Admitting at 8:10 AM     Call this number if you have problems the morning of surgery [phone]    Remember: NO SOLID FOOD AFTER MIDNIGHT THE NIGHT PRIOR TO SURGERY. NOTHING BY MOUTH EXCEPT CLEAR LIQUIDS UNTIL 7:40 AM . PLEASE FINISH ENSURE DRINK PER SURGEON ORDER  WHICH NEEDS TO BE COMPLETED AT 7:40 AM.   CLEAR LIQUID DIET   Foods Allowed                                                                     Foods Excluded  Coffee and tea, regular and decaf                             liquids that you cannot  Plain Jell-O any favor except red or purple                                           see through such as: Fruit ices (not with fruit pulp)                                     milk, soups, orange juice  Iced Popsicles                                    All solid food Carbonated beverages, regular and diet                                    Cranberry, grape and apple juices Sports drinks like Gatorade Lightly seasoned clear broth or consume(fat free) Sugar, honey syrup  _____________________________________________________________________      Take these medicines the morning of surgery with A SIP OF WATER: Atenolol (Tenormin)  BRUSH YOUR TEETH MORNING OF SURGERY AND RINSE YOUR MOUTH OUT, NO CHEWING GUM CANDY OR MINTS.                               You may not have any metal on your body including hair pins and              piercings     Do not wear jewelry, make-up, lotions,  powders or perfumes, deodorant  Do not wear nail polish on your fingernails.  Do not shave  48 hours prior to surgery.                Do not bring valuables to the hospital. [place].  Contacts, dentures or bridgework may not be worn into surgery.  You may bring overnight bag    Special Instructions: N/A              Please read over the following fact sheets you were given: _____________________________________________________________________             [facility] - Preparing for Surgery Before surgery, you can play an important role.  Because skin is not sterile, your skin needs to be as free of germs as possible.  You can reduce the number of germs on your skin by washing with CHG (chlorahexidine gluconate) soap before surgery.  CHG is an antiseptic cleaner which kills germs and bonds with the skin to continue killing germs even after washing. Please DO NOT use if you have an allergy to CHG or antibacterial soaps.  If your skin becomes reddened/irritated stop using the CHG and inform your nurse when you arrive at Short Stay. Do not shave (including legs and underarms) for at least 48 hours prior to the first CHG shower.  You may shave your face/neck. Please follow these instructions carefully:  1.  Shower with CHG Soap the night before surgery and the  morning of Surgery.  2.  If you choose to wash your hair, wash your hair first as usual with your  normal  shampoo.  3.  After you shampoo, rinse your hair and body thoroughly to remove the  shampoo.                           4.  Use CHG as you would any other liquid soap.  You can apply chg directly  to the skin and wash                       Gently with a scrungie or clean washcloth.  5.  Apply the CHG Soap to your body ONLY FROM THE NECK DOWN.   Do not use on face/ open                           Wound or open sores. Avoid contact with eyes, ears mouth and genitals  (private parts).                       Wash face,  Genitals (private parts) with your normal soap.             6.  Wash thoroughly, paying special attention to the area where your surgery  will be performed.  7.  Thoroughly rinse your body with warm water from the neck down.  8.  DO NOT shower/wash with your normal soap after using and rinsing off  the CHG Soap.                9.  Pat yourself dry with a clean towel.            10.  Wear clean pajamas.            11.  Place clean sheets on your bed the night of your first shower and do not  sleep with pets. Day of Surgery : Do not apply any lotions/deodorants the morning of surgery.  Please wear clean clothes to the hospital/surgery center.  FAILURE TO FOLLOW THESE INSTRUCTIONS MAY RESULT IN THE CANCELLATION OF YOUR SURGERY PATIENT SIGNATURE_________________________________  NURSE SIGNATURE__________________________________  ________________________________________________________________________   [name]  An incentive spirometer is a tool that can help keep your lungs clear and active. This tool measures how well you are filling your lungs with each breath. Taking long deep breaths may help reverse or decrease the chance of developing breathing (pulmonary) problems (especially infection) following:  A long period of time when you are unable to move or be active. BEFORE THE PROCEDURE   If the spirometer includes an indicator to show your best effort, your nurse or respiratory therapist will set it to a desired goal.  If possible, sit up straight or lean slightly forward. Try not to slouch.  Hold the incentive spirometer in an upright position. INSTRUCTIONS FOR USE  1. Sit on the edge of your bed if possible, or sit up as far as you can in bed or on a chair. 2. Hold the incentive spirometer in an upright position. 3. Breathe out normally. 4. Place the mouthpiece in your mouth and seal your lips tightly around  it. 5. Breathe in slowly and as deeply as possible, raising the piston or the ball toward the top of the column. 6. Hold your breath for 3-5 seconds or for as long as possible. Allow the piston or ball to fall to the bottom of the column. 7. Remove the mouthpiece from your mouth and breathe out normally. 8. Rest for a few seconds and repeat Steps 1 through 7 at least 10 times every 1-2 hours when you are awake. Take your time and take a few normal breaths between deep breaths. 9. The spirometer may include an indicator to show your best effort. Use the indicator as a goal to work toward during each repetition. 10. After each set of 10 deep breaths, practice coughing to be sure your lungs are clear. If you have an incision (the cut made at the time of surgery), support your incision when coughing by placing a pillow or rolled up towels firmly against it. Once you are able to get out of bed, walk around indoors and cough well. You may stop using the incentive spirometer when instructed by your caregiver.  RISKS AND COMPLICATIONS  Take your time so you do not get dizzy or light-headed.  If you are in pain, you may need to take or ask for pain medication before doing incentive spirometry. It is harder to take a deep breath if you are having pain. AFTER USE  Rest and breathe slowly and easily.  It can be helpful to keep track of a log of your progress. Your caregiver can provide you with a simple table to help with this. If you are using the spirometer at home, follow these instructions: [place] IF:   You are having difficultly using the spirometer.  You have trouble using the spirometer as often as instructed.  Your pain medication is not giving enough relief while using the spirometer.  You develop fever of 100.5 F (38.1 C) or higher. SEEK IMMEDIATE MEDICAL CARE IF:   You cough up bloody sputum that had not been present before.  You develop fever of 102 F (38.9 C) or  greater.  You develop worsening pain at or near the incision site. MAKE SURE YOU:   Understand these instructions.  Will watch your condition.  Will get help right away if you are not doing well or get worse. Document Released: 07/27/2006 Document Revised: 06/08/2011 Document Reviewed: 09/27/2006 ExitCare Patient Information 2014 [employer], [place].   ________________________________________________________________________  WHAT IS A BLOOD TRANSFUSION? Blood Transfusion Information  A transfusion is the replacement of blood or some of its parts. Blood is made up of multiple cells which provide different functions.  Red blood cells carry oxygen and are used for blood loss replacement.  White blood cells fight against infection.  Platelets control bleeding.  Plasma helps clot blood.  Other blood products are available for specialized needs, such as hemophilia or other clotting disorders. BEFORE THE TRANSFUSION  Who gives blood for transfusions?   Healthy volunteers who are fully evaluated to make sure their blood is safe. This is blood bank blood. Transfusion therapy is the safest it has ever been in the practice of medicine. Before blood is taken from a donor, a complete history is taken to make sure that person has no history of diseases nor engages in risky social behavior (examples are intravenous drug use or sexual activity with multiple partners). The donor's travel history is screened to minimize risk of transmitting infections, such as malaria. The donated blood is tested for signs of infectious diseases, such as HIV and hepatitis. The blood is then tested to be sure it is compatible with you in order to minimize the chance of a transfusion reaction. If you or a relative donates blood, this is often done in anticipation of surgery and is not appropriate for emergency situations. It takes many days to process the donated blood. RISKS AND COMPLICATIONS Although transfusion therapy  is very safe and saves many lives, the main dangers of transfusion include:   Getting an infectious disease.  Developing a transfusion reaction. This is an allergic reaction to something in the blood you were given. Every precaution is taken to prevent this. The decision to have a blood transfusion has been considered carefully by your caregiver before blood is given. Blood is not given unless the benefits outweigh the risks. AFTER THE TRANSFUSION  Right after receiving a blood transfusion, you will usually feel much better and more energetic. This is especially true if your red blood cells have gotten low (anemic). The transfusion raises the level of the red blood cells which carry oxygen, and this usually causes an energy increase.  The nurse administering the transfusion will monitor you carefully for complications. HOME CARE INSTRUCTIONS  No special instructions are needed after a transfusion. You may find your energy is better. Speak with your caregiver about any limitations on activity for underlying diseases you may have. SEEK MEDICAL CARE IF:   Your condition is not improving after your transfusion.  You develop redness or irritation at the intravenous (IV) site. SEEK IMMEDIATE MEDICAL CARE IF:  Any of the following symptoms occur over the next 12 hours:  Shaking chills.  You have a temperature by mouth above 102 F (38.9 C), not controlled by medicine.  Chest, back, or muscle pain.  People around you feel you are not acting correctly or are confused.  Shortness of breath or difficulty breathing.  Dizziness and fainting.  You get a rash or develop hives.  You have a decrease in urine output.  Your urine turns a dark color or changes to pink, red, or brown. Any of  the following symptoms occur over the next 10 days:  You have a temperature by mouth above 102 F (38.9 C), not controlled by medicine.  Shortness of breath.  Weakness after normal activity.  The white  part of the eye turns yellow (jaundice).  You have a decrease in the amount of urine or are urinating less often.  Your urine turns a dark color or changes to pink, red, or brown. Document Released: 03/13/2000 Document Revised: 06/08/2011 Document Reviewed: 10/31/2007 [facility] Patient Information 2014 [place], [place].  _______________________________________________________________________

## 2019-04-20 ENCOUNTER — Encounter (HOSPITAL_COMMUNITY): Payer: Self-pay

## 2019-04-20 ENCOUNTER — Encounter (HOSPITAL_COMMUNITY)
Admission: RE | Admit: 2019-04-20 | Discharge: 2019-04-20 | Disposition: A | Discharge status: Home / Self Care | Payer: BC Managed Care – PPO | Source: Ambulatory Visit | Attending: Specialist | Admitting: Specialist

## 2019-04-20 ENCOUNTER — Other Ambulatory Visit: Payer: Self-pay

## 2019-04-20 DIAGNOSIS — Z79899 Other long term (current) drug therapy: Secondary | ICD-10-CM | POA: Diagnosis not present

## 2019-04-20 DIAGNOSIS — K219 Gastro-esophageal reflux disease without esophagitis: Secondary | ICD-10-CM | POA: Diagnosis not present

## 2019-04-20 DIAGNOSIS — Z85038 Personal history of other malignant neoplasm of large intestine: Secondary | ICD-10-CM | POA: Insufficient documentation

## 2019-04-20 DIAGNOSIS — G4733 Obstructive sleep apnea (adult) (pediatric): Secondary | ICD-10-CM | POA: Insufficient documentation

## 2019-04-20 DIAGNOSIS — Z96651 Presence of right artificial knee joint: Secondary | ICD-10-CM | POA: Insufficient documentation

## 2019-04-20 DIAGNOSIS — I480 Paroxysmal atrial fibrillation: Secondary | ICD-10-CM | POA: Insufficient documentation

## 2019-04-20 DIAGNOSIS — Z7901 Long term (current) use of anticoagulants: Secondary | ICD-10-CM | POA: Insufficient documentation

## 2019-04-20 DIAGNOSIS — M1712 Unilateral primary osteoarthritis, left knee: Secondary | ICD-10-CM | POA: Diagnosis not present

## 2019-04-20 DIAGNOSIS — Z01818 Encounter for other preprocedural examination: Secondary | ICD-10-CM | POA: Diagnosis present

## 2019-04-20 DIAGNOSIS — Z9049 Acquired absence of other specified parts of digestive tract: Secondary | ICD-10-CM | POA: Insufficient documentation

## 2019-04-20 DIAGNOSIS — Z8673 Personal history of transient ischemic attack (TIA), and cerebral infarction without residual deficits: Secondary | ICD-10-CM | POA: Insufficient documentation

## 2019-04-20 DIAGNOSIS — I10 Essential (primary) hypertension: Secondary | ICD-10-CM | POA: Diagnosis not present

## 2019-04-20 HISTORY — DX: Cardiac arrhythmia, unspecified: I49.9

## 2019-04-20 HISTORY — DX: Essential (primary) hypertension: I10

## 2019-04-20 LAB — COMPREHENSIVE METABOLIC PANEL
ALT: 14 U/L (ref 0–44)
AST: 16 U/L (ref 15–41)
Albumin: 3.7 g/dL (ref 3.5–5.0)
Alkaline Phosphatase: 97 U/L (ref 38–126)
Anion gap: 6 (ref 5–15)
BUN: 10 mg/dL (ref 6–20)
CO2: 26 mmol/L (ref 22–32)
Calcium: 8.8 mg/dL — ABNORMAL LOW (ref 8.9–10.3)
Chloride: 109 mmol/L (ref 98–111)
Creatinine, Ser: 0.88 mg/dL (ref 0.44–1.00)
GFR calc Af Amer: >60 mL/min (ref >60)
GFR calc non Af Amer: >60 mL/min (ref >60)
Glucose, Bld: 93 mg/dL (ref 70–99)
Potassium: 4.3 mmol/L (ref 3.5–5.1)
Sodium: 141 mmol/L (ref 135–145)
Total Bilirubin: 0.3 mg/dL (ref 0.3–1.2)
Total Protein: 7.2 g/dL (ref 6.5–8.1)

## 2019-04-20 LAB — CBC
HCT: 37.3 % (ref 36.0–46.0)
Hemoglobin: 11.8 g/dL — ABNORMAL LOW (ref 12.0–15.0)
MCH: 27.1 pg (ref 26.0–34.0)
MCHC: 31.6 g/dL (ref 30.0–36.0)
MCV: 85.6 fL (ref 80.0–100.0)
Platelets: 293 10*3/uL (ref 150–400)
RBC: 4.36 MIL/uL (ref 3.87–5.11)
RDW: 13.1 % (ref 11.5–15.5)
WBC: 5.4 10*3/uL (ref 4.0–10.5)
nRBC: 0 % (ref 0.0–0.2)

## 2019-04-20 LAB — PROTIME-INR
INR: 1 (ref 0.8–1.2)
Prothrombin Time: 12.9 seconds (ref 11.4–15.2)

## 2019-04-20 LAB — APTT: aPTT: 30 seconds (ref 24–36)

## 2019-04-20 LAB — SURGICAL PCR SCREEN
MRSA, PCR: NEGATIVE
Staphylococcus aureus: NEGATIVE

## 2019-04-20 MED ORDER — BUPIVACAINE LIPOSOME 1.3 % IJ SUSP
20.0000 mL | Freq: Once | INTRAMUSCULAR | Status: DC
  Filled 2019-04-20: qty 20

## 2019-04-20 NOTE — Progress Notes (Signed)
Anesthesia Chart Review   Case: [id number] Date/Time: 04/21/19 1026   Procedure: TOTAL KNEE ARTHROPLASTY (Left Knee) - adductor canal   Anesthesia type: Spinal   Pre-op diagnosis: Left knee osteoarthritis   Location: [facility] ROOM 07 / [facility]   Surgeons: Sydnee Cabal, MD      DISCUSSION:54 y.o. never smoker with h/o GERD, OSA, CVA, PAF (on Eliquis), HTN, left knee OA scheduled for above procedure 04/21/19 with Dr. Sydnee Cabal.   Pt last seen by PCP, Dr. Bartholome Bill, 04/10/19 for preoperative evaluation.  Per OV note, "Examination fairly unremarkable.  She is at moderate risk for surgery due to her underlying chronic cardiac problems.  Due to her proximal atrial fibrillation and chronic anticoagulation therapy, she will need to get additional medical clearance from her cardiologist.  She needs to get permission from her cardiologist to discontinue the Eliquis for 2 to 3 days prior to knee replacement per-per orthopedics request.  She states that her cardiologist is already cleared for the surgery as far she knows.  Paroxysmal atrial fibrillation-rate controlled.  Currently in sinus rhythm.  Continue follow-up with cardiology.  Hypertension-well-controlled.  Low-salt diet recommended try to lose more weight.  She is requesting another referral to the sleep specialist to have a repeat sleep study done.  She states many years ago she was told she has sleep apnea but then she had a sleep study-2 years later we stated that she did not have sleep apnea.  She feels tired.  She is wanting another sleep study arranged.  Referral done to sleep specialist."  Pt last seen by cardiologist 01/30/2019.  Stable at this visit.  H/o precordial chest pain which has resolved, negative stress test.  No changes made at this visit.  9 month follow up recommended. Cardiac clearance received which states pt is low risk for planned procedure, on chart. Normal stress test 08/25/2018, results on chart.    Pt reports to PAT nurse  she was advised to hold Eliquis 2 days prior to procedure.  Contacted Dr. Theda Sers scheduler to discuss Eliquis only being held for 2 days, anesthesia type currently Spinal.   VS: There were no vitals taken for this visit.  PROVIDERS: System, Pcp Not In  Bartholome Bill, MD is PCP  Verdell Face, MD is Cardiologist  LABS: labs pending (all labs ordered are listed, but only abnormal results are displayed)  Labs Reviewed - No data to display   IMAGES:   EKG: EKG on chart   CV: Stress test results on chart Past Medical History:  Diagnosis Date  . Arthritis   . Carpal tunnel syndrome   . Colon cancer ([place]) 2004  . Dysrhythmia   . GERD (gastroesophageal reflux disease)   . H/O hiatal hernia   . Herniated nucleus pulposus   . Hypertension   . Paroxysmal atrial fibrillation ([place])    evaluated by [facility]     Past Surgical History:  Procedure Laterality Date  . ABDOMINAL HYSTERECTOMY  2007  . BACK SURGERY    . CARPAL TUNNEL RELEASE  2011   bilateral  . CHOLECYSTECTOMY  2004  . JOINT REPLACEMENT Right 2015   Total Knee Replacement  . LAMINECTOMY  2011   "lower back"  . PARTIAL COLECTOMY  12/2002   "colon cancer"  . PORT-A-CATH REMOVAL  2005   left chest  . PORTACATH PLACEMENT  2004   left chest  . POSTERIOR FUSION LUMBAR SPINE  07/08/11   L5-S1  . stomach reconstruction  2005  d/t clot post partial colectomy  . TONSILLECTOMY AND ADENOIDECTOMY  1971    MEDICATIONS: . apixaban (ELIQUIS) 5 MG TABS tablet  . atenolol (TENORMIN) 25 MG tablet   No current facility-administered medications for this encounter.   Derrill Memo ON 04/21/2019] bupivacaine liposome (EXPAREL) 1.3 % injection 266 mg    Maia Plan [facility] Pre-Surgical Testing [phone] 04/20/19  3:23 PM

## 2019-04-21 ENCOUNTER — Encounter (HOSPITAL_COMMUNITY): Payer: Self-pay | Admitting: Specialist

## 2019-04-21 ENCOUNTER — Encounter (HOSPITAL_COMMUNITY)
Admission: RE | Disposition: A | Discharge status: Home / Self Care | Payer: Self-pay | Source: Home / Self Care | Attending: Specialist

## 2019-04-21 ENCOUNTER — Inpatient Hospital Stay (HOSPITAL_COMMUNITY): Payer: BC Managed Care – PPO | Admitting: Physician Assistant

## 2019-04-21 ENCOUNTER — Other Ambulatory Visit: Payer: Self-pay

## 2019-04-21 ENCOUNTER — Observation Stay (HOSPITAL_COMMUNITY)
Admission: RE | Admit: 2019-04-21 | Discharge: 2019-04-22 | Disposition: A | Discharge status: Home / Self Care | Payer: BC Managed Care – PPO | Attending: Specialist | Admitting: Specialist

## 2019-04-21 DIAGNOSIS — Z9049 Acquired absence of other specified parts of digestive tract: Secondary | ICD-10-CM | POA: Insufficient documentation

## 2019-04-21 DIAGNOSIS — I480 Paroxysmal atrial fibrillation: Secondary | ICD-10-CM | POA: Insufficient documentation

## 2019-04-21 DIAGNOSIS — M1712 Unilateral primary osteoarthritis, left knee: Secondary | ICD-10-CM | POA: Diagnosis present

## 2019-04-21 DIAGNOSIS — Z79899 Other long term (current) drug therapy: Secondary | ICD-10-CM | POA: Diagnosis not present

## 2019-04-21 DIAGNOSIS — I10 Essential (primary) hypertension: Secondary | ICD-10-CM | POA: Diagnosis not present

## 2019-04-21 DIAGNOSIS — Z85038 Personal history of other malignant neoplasm of large intestine: Secondary | ICD-10-CM | POA: Insufficient documentation

## 2019-04-21 DIAGNOSIS — Z7901 Long term (current) use of anticoagulants: Secondary | ICD-10-CM | POA: Diagnosis not present

## 2019-04-21 DIAGNOSIS — Z6841 Body Mass Index (BMI) 40.0 and over, adult: Secondary | ICD-10-CM | POA: Diagnosis not present

## 2019-04-21 DIAGNOSIS — K219 Gastro-esophageal reflux disease without esophagitis: Secondary | ICD-10-CM | POA: Diagnosis not present

## 2019-04-21 HISTORY — PX: TOTAL KNEE ARTHROPLASTY: SHX125

## 2019-04-21 LAB — TYPE AND SCREEN
ABO/RH(D): O POS
Antibody Screen: NEGATIVE

## 2019-04-21 LAB — ABO/RH: ABO/RH(D): O POS

## 2019-04-21 LAB — HIV ANTIBODY (ROUTINE TESTING W REFLEX): HIV Screen 4th Generation wRfx: NONREACTIVE

## 2019-04-21 SURGERY — ARTHROPLASTY, KNEE, TOTAL
Anesthesia: Spinal | Site: Knee | Laterality: Left

## 2019-04-21 MED ORDER — POVIDONE-IODINE 10 % EX SWAB
2.0000 "application " | Freq: Once | CUTANEOUS | Status: AC
  Administered 2019-04-21: 2 via TOPICAL

## 2019-04-21 MED ORDER — SODIUM CHLORIDE 0.9 % IV SOLN: INTRAVENOUS | Status: DC

## 2019-04-21 MED ORDER — CHLORHEXIDINE GLUCONATE 4 % EX LIQD: 60.0000 mL | Freq: Once | CUTANEOUS | Status: DC

## 2019-04-21 MED ORDER — CEFAZOLIN SODIUM-DEXTROSE 1-4 GM/50ML-% IV SOLN
1.0000 g | Freq: Three times a day (TID) | INTRAVENOUS | Status: AC
  Administered 2019-04-21 – 2019-04-22 (×2): 1 g via INTRAVENOUS
  Filled 2019-04-21 (×2): qty 50

## 2019-04-21 MED ORDER — METHOCARBAMOL 500 MG PO TABS
500.0000 mg | ORAL_TABLET | Freq: Four times a day (QID) | ORAL | Status: DC | PRN
  Administered 2019-04-21 – 2019-04-22 (×2): 500 mg via ORAL
  Filled 2019-04-21 (×2): qty 1

## 2019-04-21 MED ORDER — OXYCODONE HCL 5 MG PO TABS: ORAL_TABLET | ORAL | 0 refills | Status: DC

## 2019-04-21 MED ORDER — STERILE WATER FOR IRRIGATION IR SOLN
Status: DC | PRN
  Administered 2019-04-21: 2000 mL

## 2019-04-21 MED ORDER — SODIUM CHLORIDE (PF) 0.9 % IJ SOLN
INTRAMUSCULAR | Status: AC
  Filled 2019-04-21: qty 10

## 2019-04-21 MED ORDER — ACETAMINOPHEN 325 MG PO TABS: 325.0000 mg | ORAL_TABLET | Freq: Four times a day (QID) | ORAL | Status: DC | PRN

## 2019-04-21 MED ORDER — PROPOFOL 10 MG/ML IV BOLUS
INTRAVENOUS | Status: DC | PRN
  Administered 2019-04-21: 30 mg via INTRAVENOUS
  Administered 2019-04-21: 20 mg via INTRAVENOUS

## 2019-04-21 MED ORDER — GABAPENTIN 300 MG PO CAPS
300.0000 mg | ORAL_CAPSULE | Freq: Three times a day (TID) | ORAL | Status: DC
  Administered 2019-04-21 – 2019-04-22 (×3): 300 mg via ORAL
  Filled 2019-04-21 (×3): qty 1

## 2019-04-21 MED ORDER — METHOCARBAMOL 500 MG IVPB - SIMPLE MED
500.0000 mg | Freq: Four times a day (QID) | INTRAVENOUS | Status: DC | PRN
  Filled 2019-04-21: qty 50

## 2019-04-21 MED ORDER — CEFAZOLIN SODIUM-DEXTROSE 2-4 GM/100ML-% IV SOLN
2.0000 g | INTRAVENOUS | Status: AC
  Administered 2019-04-21: 2 g via INTRAVENOUS
  Filled 2019-04-21: qty 100

## 2019-04-21 MED ORDER — DIPHENHYDRAMINE HCL 12.5 MG/5ML PO ELIX
12.5000 mg | ORAL_SOLUTION | ORAL | Status: DC | PRN
  Filled 2019-04-21: qty 10

## 2019-04-21 MED ORDER — SODIUM CHLORIDE (PF) 0.9 % IJ SOLN
INTRAMUSCULAR | Status: DC | PRN
  Administered 2019-04-21: 60 mL

## 2019-04-21 MED ORDER — TRAMADOL HCL 50 MG PO TABS
50.0000 mg | ORAL_TABLET | Freq: Four times a day (QID) | ORAL | Status: DC
  Administered 2019-04-21 – 2019-04-22 (×4): 50 mg via ORAL
  Filled 2019-04-21 (×4): qty 1

## 2019-04-21 MED ORDER — SODIUM CHLORIDE (PF) 0.9 % IJ SOLN
INTRAMUSCULAR | Status: AC
  Filled 2019-04-21: qty 50

## 2019-04-21 MED ORDER — HYDROMORPHONE HCL 1 MG/ML IJ SOLN: 0.5000 mg | INTRAMUSCULAR | Status: DC | PRN

## 2019-04-21 MED ORDER — 0.9 % SODIUM CHLORIDE (POUR BTL) OPTIME
TOPICAL | Status: DC | PRN
  Administered 2019-04-21: 1000 mL

## 2019-04-21 MED ORDER — ONDANSETRON HCL 4 MG PO TABS
4.0000 mg | ORAL_TABLET | Freq: Four times a day (QID) | ORAL | Status: DC | PRN
  Filled 2019-04-21: qty 1

## 2019-04-21 MED ORDER — METHOCARBAMOL 500 MG PO TABS: 500.0000 mg | ORAL_TABLET | Freq: Four times a day (QID) | ORAL | 0 refills | Status: DC

## 2019-04-21 MED ORDER — OXYCODONE HCL 5 MG PO TABS
5.0000 mg | ORAL_TABLET | ORAL | Status: DC | PRN
  Administered 2019-04-21: 16:00:00 5 mg via ORAL
  Administered 2019-04-22 (×2): 10 mg via ORAL
  Filled 2019-04-21: qty 2
  Filled 2019-04-21: qty 1
  Filled 2019-04-21: qty 2

## 2019-04-21 MED ORDER — BUPIVACAINE IN DEXTROSE 0.75-8.25 % IT SOLN
INTRATHECAL | Status: DC | PRN
  Administered 2019-04-21: 1.6 mL via INTRATHECAL

## 2019-04-21 MED ORDER — OXYCODONE HCL 5 MG PO TABS
10.0000 mg | ORAL_TABLET | ORAL | Status: DC | PRN
  Administered 2019-04-21 – 2019-04-22 (×2): 10 mg via ORAL
  Filled 2019-04-21 (×2): qty 2

## 2019-04-21 MED ORDER — DEXAMETHASONE SODIUM PHOSPHATE 10 MG/ML IJ SOLN
INTRAMUSCULAR | Status: DC | PRN
  Administered 2019-04-21: 10 mg

## 2019-04-21 MED ORDER — ENOXAPARIN SODIUM 30 MG/0.3ML ~~LOC~~ SOLN: 30.0000 mg | Freq: Two times a day (BID) | SUBCUTANEOUS | Status: DC

## 2019-04-21 MED ORDER — MIDAZOLAM HCL 2 MG/2ML IJ SOLN
1.0000 mg | INTRAMUSCULAR | Status: DC
  Administered 2019-04-21: 2 mg via INTRAVENOUS
  Filled 2019-04-21: qty 2

## 2019-04-21 MED ORDER — MAGNESIUM CITRATE PO SOLN
1.0000 | Freq: Once | ORAL | Status: DC | PRN
  Filled 2019-04-21: qty 296

## 2019-04-21 MED ORDER — ONDANSETRON HCL 4 MG PO TABS: 4.0000 mg | ORAL_TABLET | Freq: Three times a day (TID) | ORAL | 1 refills | Status: AC | PRN

## 2019-04-21 MED ORDER — DEXAMETHASONE SODIUM PHOSPHATE 10 MG/ML IJ SOLN: 8.0000 mg | Freq: Once | INTRAMUSCULAR | Status: DC

## 2019-04-21 MED ORDER — PROPOFOL 500 MG/50ML IV EMUL
INTRAVENOUS | Status: AC
  Filled 2019-04-21: qty 50

## 2019-04-21 MED ORDER — SODIUM CHLORIDE 0.9 % IR SOLN
Status: DC | PRN
  Administered 2019-04-21: 1000 mL

## 2019-04-21 MED ORDER — PROPOFOL 500 MG/50ML IV EMUL
INTRAVENOUS | Status: DC | PRN
  Administered 2019-04-21: 50 ug/kg/min via INTRAVENOUS

## 2019-04-21 MED ORDER — SENNOSIDES-DOCUSATE SODIUM 8.6-50 MG PO TABS
1.0000 | ORAL_TABLET | Freq: Every evening | ORAL | Status: DC | PRN
  Filled 2019-04-21: qty 1

## 2019-04-21 MED ORDER — ACETAMINOPHEN 500 MG PO TABS
1000.0000 mg | ORAL_TABLET | Freq: Four times a day (QID) | ORAL | Status: AC
  Administered 2019-04-21 – 2019-04-22 (×4): 1000 mg via ORAL
  Filled 2019-04-21 (×4): qty 2

## 2019-04-21 MED ORDER — BISACODYL 5 MG PO TBEC
5.0000 mg | DELAYED_RELEASE_TABLET | Freq: Every day | ORAL | Status: DC | PRN
  Filled 2019-04-21: qty 1

## 2019-04-21 MED ORDER — ONDANSETRON HCL 4 MG/2ML IJ SOLN: 4.0000 mg | Freq: Four times a day (QID) | INTRAMUSCULAR | Status: DC | PRN

## 2019-04-21 MED ORDER — APIXABAN 2.5 MG PO TABS
2.5000 mg | ORAL_TABLET | Freq: Two times a day (BID) | ORAL | Status: DC
  Administered 2019-04-22: 2.5 mg via ORAL
  Filled 2019-04-21 (×2): qty 1

## 2019-04-21 MED ORDER — BUPIVACAINE LIPOSOME 1.3 % IJ SUSP
INTRAMUSCULAR | Status: DC | PRN
  Administered 2019-04-21: 20 mL

## 2019-04-21 MED ORDER — FENTANYL CITRATE (PF) 100 MCG/2ML IJ SOLN
50.0000 ug | INTRAMUSCULAR | Status: DC
  Administered 2019-04-21: 10:00:00 100 ug via INTRAVENOUS
  Filled 2019-04-21: qty 2

## 2019-04-21 MED ORDER — BUPIVACAINE HCL (PF) 0.5 % IJ SOLN
INTRAMUSCULAR | Status: DC | PRN
  Administered 2019-04-21: 30 mL via PERINEURAL

## 2019-04-21 MED ORDER — CLONIDINE HCL (ANALGESIA) 100 MCG/ML EP SOLN
EPIDURAL | Status: DC | PRN
  Administered 2019-04-21: 100 ug

## 2019-04-21 MED ORDER — ONDANSETRON HCL 4 MG/2ML IJ SOLN
INTRAMUSCULAR | Status: DC | PRN
  Administered 2019-04-21: 4 mg via INTRAVENOUS

## 2019-04-21 MED ORDER — LACTATED RINGERS IV SOLN: INTRAVENOUS | Status: DC

## 2019-04-21 MED ORDER — TRANEXAMIC ACID-NACL 1000-0.7 MG/100ML-% IV SOLN
1000.0000 mg | INTRAVENOUS | Status: AC
  Administered 2019-04-21: 1000 mg via INTRAVENOUS
  Filled 2019-04-21: qty 100

## 2019-04-21 SURGICAL SUPPLY — 68 items
ADH SKN CLS APL DERMABOND .7 (GAUZE/BANDAGES/DRESSINGS) ×1
ATTUNE PSFEM LTSZ5 NARCEM KNEE (Femur) ×2 IMPLANT
ATTUNE PSRP INSR SZ5 5 KNEE (Insert) ×1 IMPLANT
ATTUNE PSRP INSR SZ5 5MM KNEE (Insert) ×1 IMPLANT
BASE TIBIAL ROT PLAT SZ 5 KNEE (Knees) IMPLANT
BLADE SAG 18X100X1.27 (BLADE) ×3 IMPLANT
BLADE SAW SGTL 11.0X1.19X90.0M (BLADE) ×3 IMPLANT
BNDG ELASTIC 4X5.8 VLCR STR LF (GAUZE/BANDAGES/DRESSINGS) ×3 IMPLANT
BNDG ELASTIC 6X5.8 VLCR STR LF (GAUZE/BANDAGES/DRESSINGS) ×3 IMPLANT
BOWL SMART MIX CTS (DISPOSABLE) ×3 IMPLANT
BSPLAT TIB 5 CMNT ROT PLAT STR (Knees) ×1 IMPLANT
CEMENT HV SMART SET (Cement) ×4 IMPLANT
COVER SURGICAL LIGHT HANDLE (MISCELLANEOUS) ×3 IMPLANT
COVER WAND RF STERILE (DRAPES) IMPLANT
CUFF TOURN SGL QUICK 34 (TOURNIQUET CUFF) ×3
CUFF TRNQT CYL 34X4.125X (TOURNIQUET CUFF) ×1 IMPLANT
DECANTER SPIKE VIAL GLASS SM (MISCELLANEOUS) ×3 IMPLANT
DERMABOND ADVANCED (GAUZE/BANDAGES/DRESSINGS) ×2
DERMABOND ADVANCED .7 DNX12 (GAUZE/BANDAGES/DRESSINGS) ×1 IMPLANT
DRAPE U-SHAPE 47X51 STRL (DRAPES) ×3 IMPLANT
DRSG AQUACEL AG ADV 3.5X10 (GAUZE/BANDAGES/DRESSINGS) ×3 IMPLANT
DRSG TEGADERM 4X4.75 (GAUZE/BANDAGES/DRESSINGS) ×3 IMPLANT
DURAPREP 26ML APPLICATOR (WOUND CARE) ×6 IMPLANT
ELECT REM PT RETURN 15FT ADLT (MISCELLANEOUS) ×3 IMPLANT
EVACUATOR 1/8 PVC DRAIN (DRAIN) ×3 IMPLANT
GAUZE SPONGE 2X2 8PLY STRL LF (GAUZE/BANDAGES/DRESSINGS) ×1 IMPLANT
GLOVE BIOGEL PI IND STRL 7.5 (GLOVE) ×1 IMPLANT
GLOVE BIOGEL PI IND STRL 8 (GLOVE) ×1 IMPLANT
GLOVE BIOGEL PI INDICATOR 7.5 (GLOVE) ×2
GLOVE BIOGEL PI INDICATOR 8 (GLOVE) ×2
GLOVE ECLIPSE 8.0 STRL XLNG CF (GLOVE) ×3 IMPLANT
GLOVE SURG ORTHO 9.0 STRL STRW (GLOVE) ×3 IMPLANT
GLOVE SURG SS PI 7.0 STRL IVOR (GLOVE) ×3 IMPLANT
GOWN STRL REUS W/TWL XL LVL3 (GOWN DISPOSABLE) ×6 IMPLANT
HANDPIECE INTERPULSE COAX TIP (DISPOSABLE) ×3
HOLDER FOLEY CATH W/STRAP (MISCELLANEOUS) ×2 IMPLANT
JET LAVAGE IRRISEPT WOUND (IRRIGATION / IRRIGATOR) ×3
KIT TURNOVER KIT A (KITS) IMPLANT
LAVAGE JET IRRISEPT WOUND (IRRIGATION / IRRIGATOR) ×1 IMPLANT
NS IRRIG 1000ML POUR BTL (IV SOLUTION) ×3 IMPLANT
PACK TOTAL KNEE CUSTOM (KITS) ×3 IMPLANT
PATELLA MEDIAL ATTUN 35MM KNEE (Knees) ×2 IMPLANT
PENCIL SMOKE EVACUATOR (MISCELLANEOUS) ×2 IMPLANT
PIN DRILL FIX HALF THREAD (BIT) ×2 IMPLANT
PIN STEINMAN FIXATION KNEE (PIN) ×2 IMPLANT
PROTECTOR NERVE ULNAR (MISCELLANEOUS) ×3 IMPLANT
SET HNDPC FAN SPRY TIP SCT (DISPOSABLE) ×1 IMPLANT
SET PAD KNEE POSITIONER (MISCELLANEOUS) ×3 IMPLANT
SPONGE GAUZE 2X2 STER 10/PKG (GAUZE/BANDAGES/DRESSINGS) ×2
SPONGE LAP 18X18 RF (DISPOSABLE) IMPLANT
SPONGE SURGIFOAM ABS GEL 100 (HEMOSTASIS) ×3 IMPLANT
STOCKINETTE 6  STRL (DRAPES) ×2
STOCKINETTE 6 STRL (DRAPES) ×1 IMPLANT
SUT BONE WAX W31G (SUTURE) IMPLANT
SUT MNCRL AB 3-0 PS2 18 (SUTURE) ×3 IMPLANT
SUT VIC AB 1 CT1 27 (SUTURE) ×9
SUT VIC AB 1 CT1 27XBRD ANTBC (SUTURE) ×3 IMPLANT
SUT VIC AB 2-0 CT1 27 (SUTURE) ×6
SUT VIC AB 2-0 CT1 TAPERPNT 27 (SUTURE) ×2 IMPLANT
SUT VLOC 180 0 24IN GS25 (SUTURE) ×3 IMPLANT
SYR 50ML LL SCALE MARK (SYRINGE) ×2 IMPLANT
TAPE STRIPS DRAPE STRL (GAUZE/BANDAGES/DRESSINGS) ×3 IMPLANT
TIBIAL BASE ROT PLAT SZ 5 KNEE (Knees) ×3 IMPLANT
TRAY FOLEY MTR SLVR 14FR STAT (SET/KITS/TRAYS/PACK) ×2 IMPLANT
TRAY FOLEY MTR SLVR 16FR STAT (SET/KITS/TRAYS/PACK) ×3 IMPLANT
WATER STERILE IRR 1000ML POUR (IV SOLUTION) ×6 IMPLANT
WRAP KNEE MAXI GEL POST OP (GAUZE/BANDAGES/DRESSINGS) ×3 IMPLANT
YANKAUER SUCT BULB TIP 10FT TU (MISCELLANEOUS) ×3 IMPLANT

## 2019-04-21 NOTE — Progress Notes (Signed)
AssistedDr. Lissa Hoard with left, ultrasound guided, adductor canal and Ipack block. Side rails up, monitors on throughout procedure. See vital signs in flow sheet. Tolerated Procedure well.

## 2019-04-21 NOTE — Anesthesia Procedure Notes (Signed)
Anesthesia Regional Block: Adductor canal block   Pre-Anesthetic Checklist: ,, timeout performed, Correct Patient, Correct Site, Correct Laterality, Correct Procedure, Correct Position, site marked, Risks and benefits discussed,  Surgical consent,  Pre-op evaluation,  At surgeon's request and post-op pain management  Laterality: Left  Prep: chloraprep       Needles:  Injection technique: Single-shot  Needle Type: Stimiplex     Needle Length: 9cm  Needle Gauge: 21     Additional Needles:   Procedures:,,,, ultrasound used (permanent image in chart),,,,  Narrative:  Start time: 04/21/2019 10:00 AM End time: 04/21/2019 10:05 AM Injection made incrementally with aspirations every 5 mL.  Performed by: Personally  Anesthesiologist: Nolon Nations, MD  Additional Notes: BP cuff, EKG monitors applied. Sedation begun. Artery and nerve location verified with U/S and anesthetic injected incrementally, slowly, and after negative aspirations under direct u/s guidance. Good fascial /perineural spread. Tolerated well.

## 2019-04-21 NOTE — Transfer of Care (Signed)
Immediate Anesthesia Transfer of Care Note  Patient: MARQUISE LAMBSON  Procedure(s) Performed: TOTAL KNEE ARTHROPLASTY (Left Knee)  Patient Location: PACU  Anesthesia Type:Spinal and MAC combined with regional for post-op pain  Level of Consciousness: awake, alert , oriented and patient cooperative  Airway & Oxygen Therapy: Patient Spontanous Breathing and Patient connected to nasal cannula oxygen  Post-op Assessment: Report given to RN and Post -op Vital signs reviewed and stable  Post vital signs: Reviewed and stable  Last Vitals:  Vitals Value Taken Time  BP 121/67 04/21/19 1307  Temp    Pulse 51 04/21/19 1314  Resp 12 04/21/19 1314  SpO2 98 % 04/21/19 1314  Vitals shown include unvalidated device data.  Last Pain:  Vitals:   04/21/19 0953  TempSrc:   PainSc: 0-No pain         Complications: No apparent anesthesia complications

## 2019-04-21 NOTE — Anesthesia Procedure Notes (Signed)
Procedure Name: MAC Performed by: Braylon Lemmons L, CRNA Pre-anesthesia Checklist: Patient identified, Emergency Drugs available, Suction available, Patient being monitored and Timeout performed Patient Re-evaluated:Patient Re-evaluated prior to induction Oxygen Delivery Method: Simple face mask Preoxygenation: Pre-oxygenation with 100% oxygen Induction Type: IV induction Placement Confirmation: positive ETCO2 Dental Injury: Teeth and Oropharynx as per pre-operative assessment        

## 2019-04-21 NOTE — Op Note (Signed)
DATE OF SURGERY:  04/21/2019  TIME: 12:50 PM  PATIENT NAME:  Christina Fuller    AGE: 54 y.o.   PRE-OPERATIVE DIAGNOSIS:  Left knee osteoarthritis  POST-OPERATIVE DIAGNOSIS:  Left knee osteoarthritis  PROCEDURE:  Procedure(s): TOTAL KNEE ARTHROPLASTY  SURGEON:  Dolores Ewing ANDREW  ASSISTANT:  Leeanne Haus, PA-C, present and scrubbed throughout the case, critical for assistance with exposure, retraction, instrumentation, and closure.  OPERATIVE IMPLANTS: Depuy PFC Attune Rotating Platform.  Femur size 5, Tibia size 5, Patella size 35 3-peg oval button, with a 5 mm polyethylene insert.   PREOPERATIVE INDICATIONS:   Christina Fuller is a 54 y.o. year old female with end stage bone on bone arthritis of the knee who failed conservative treatment and elected for Total Knee Arthroplasty.   The risks, benefits, and alternatives were discussed at length including but not limited to the risks of infection, bleeding, nerve injury, stiffness, blood clots, the need for revision surgery, cardiopulmonary complications, among others, and they were willing to proceed.  OPERATIVE DESCRIPTION:  The patient was brought to the operative room and placed in a supine position.  Spinal anesthesia was administered.  IV antibiotics were given.  The lower extremity was prepped and draped in the usual sterile fashion.  Time out was performed.  The leg was elevated and exsanguinated and the tourniquet was inflated.  Anterior quadriceps tendon splitting approach was performed.  The patella was retracted and osteophytes were removed.  The anterior horn of the medial and lateral meniscus was removed and cruciate ligaments resected.   The distal femur was opened with the drill and the intramedullary distal femoral cutting jig was utilized, set at 5 degrees resecting 10 mm off the distal femur.  Care was taken to protect the collateral ligaments.  The distal femoral sizing jig was applied, taking care to avoid  notching.  Then the 4-in-1 cutting jig was applied and the anterior and posterior femur was cut, along with the chamfer cuts.    Then the extramedullary tibial cutting jig was utilized making the appropriate cut using the anterior tibial crest as a reference building in appropriate posterior slope.  Care was taken during the cut to protect the medial and collateral ligaments.  The proximal tibia was removed along with the posterior horns of the menisci.   The posterior medial femoral osteophytes and posterior lateral femoral osteophytes were removed.    The flexion gap was then measured and was symmetric with the extension gap, measured at 5.  I completed the distal femoral preparation using the appropriate jig to prepare the box.  The patella was then measured, and cut with the saw.    The proximal tibia sized and prepared accordingly with the reamer and the punch, and then all components were trialed with the trial insert.  The knee was found to have excellent balance and full motion.    The above named components were then cemented into place and all excess cement was removed.  The trial polyethylene component was in place during cementation, and then was exchanged for the real polyethylene component.    The knee was easily taken through a range of motion and the patella tracked well and the knee irrigated copiously and the parapatellar and subcutaneous tissue closed with vicryl, and monocryl with steri strips for the skin.  The arthrotomy was closed at 90 of flexion. The wounds were dressed with sterile gauze and the tourniquet released and the patient was awakened and returned to the PACU in  stable and satisfactory condition.  There were no complications.  Total tourniquet time was 88 minutes.

## 2019-04-21 NOTE — Anesthesia Procedure Notes (Signed)
Anesthesia Regional Block: Knee block (iPack)   Pre-Anesthetic Checklist: ,, timeout performed, Correct Patient, Correct Site, Correct Laterality, Correct Procedure, Correct Position, site marked, Risks and benefits discussed,  Surgical consent,  Pre-op evaluation,  At surgeon's request and post-op pain management  Laterality: Left  Prep: chloraprep       Needles:  Injection technique: Single-shot  Needle Type: Stimiplex     Needle Length: 9cm  Needle Gauge: 21     Additional Needles:   Procedures:,,,, ultrasound used (permanent image in chart),,,,  Narrative:  Start time: 04/21/2019 9:55 AM End time: 04/21/2019 10:00 AM Injection made incrementally with aspirations every 5 mL.  Performed by: Personally  Anesthesiologist: Christina Nations, MD  Additional Notes: BP cuff, EKG monitors applied. Sedation begun. Artery and femur location verified with U/S and anesthetic injected incrementally, slowly, and after negative aspirations under direct u/s guidance. Good fascial spread. Tolerated well.

## 2019-04-21 NOTE — Anesthesia Preprocedure Evaluation (Addendum)
Anesthesia Evaluation  Patient identified by MRN, date of birth, ID band Patient awake    Reviewed: Allergy & Precautions, H&P , NPO status , Patient's Chart, lab work & pertinent test results, reviewed documented beta blocker date and time   History of Anesthesia Complications Negative for: history of anesthetic complications  Airway Mallampati: III  TM Distance: >3 FB Neck ROM: full    Dental no notable dental hx. (+) Teeth Intact, Dental Advisory Given   Pulmonary neg pulmonary ROS,    Pulmonary exam normal breath sounds clear to auscultation       Cardiovascular Exercise Tolerance: Good hypertension, Pt. on home beta blockers + dysrhythmias Atrial Fibrillation  Rhythm:regular Rate:Normal  h o of paroximall a fib   Neuro/Psych negative neurological ROS  negative psych ROS   GI/Hepatic Neg liver ROS, hiatal hernia, GERD  ,  Endo/Other  negative endocrine ROSMorbid obesity  Renal/GU negative Renal ROS     Musculoskeletal   Abdominal   Peds  Hematology negative hematology ROS (+)   Anesthesia Other Findings   Reproductive/Obstetrics negative OB ROS                             Anesthesia Physical  Anesthesia Plan  ASA: II  Anesthesia Plan: Spinal   Post-op Pain Management:  Regional for Post-op pain   Induction: Intravenous  PONV Risk Score and Plan: 3 and Ondansetron, Propofol infusion, Midazolam and Treatment may vary due to age or medical condition  Airway Management Planned: Natural Airway  Additional Equipment: None  Intra-op Plan:   Post-operative Plan:   Informed Consent: I have reviewed the patients History and Physical, chart, labs and discussed the procedure including the risks, benefits and alternatives for the proposed anesthesia with the patient or authorized representative who has indicated his/her understanding and acceptance.     Dental advisory  given  Plan Discussed with: CRNA  Anesthesia Plan Comments:        Anesthesia Quick Evaluation

## 2019-04-21 NOTE — Evaluation (Signed)
Physical Therapy Evaluation Patient Details Name: Christina Fuller MRN: [id number] DOB: 01/16/1966 Today's Date: 04/21/2019   History of Present Illness  Patient is 54 y.o. female s/p Lt TKA on 04/21/19 with PMH significant for HTN, GERD, colon cancer, OA.  Clinical Impression  Christina Fuller is a 54 y.o. female POD 0 s/p Lt TKA. Patient reports independence with mobility with occasional use of SPC at baseline. Patient is now limited by functional impairments (see PT problem list below) and requires min assist for transfers and gait with RW. Patient was able to ambulate ~40 feet with RW and min assist for walker management and Lt knee guarding. Patient instructed in exercise to facilitate circulation. Patient will benefit from continued skilled PT interventions to address impairments and progress towards PLOF. Acute PT will follow to progress mobility and stair training in preparation for safe discharge home.     Follow Up Recommendations Follow surgeon's recommendation for DC plan and follow-up therapies    Equipment Recommendations  Rolling walker with 5" wheels;3in1 (PT)    Recommendations for Other Services       Precautions / Restrictions Precautions Precautions: Fall Restrictions Weight Bearing Restrictions: No      Mobility  Bed Mobility Overal bed mobility: Needs Assistance Bed Mobility: Supine to Sit     Supine to sit: HOB elevated;Min assist     General bed mobility comments: cues for sequencing and assist for LE mobility  Transfers Overall transfer level: Needs assistance Equipment used: Rolling walker (2 wheeled) Transfers: Sit to/from Stand Sit to Stand: Min assist         General transfer comment: cues for hand placement and technique with RW, no overt LOB noted.  Ambulation/Gait Ambulation/Gait assistance: Min assist Gait Distance (Feet): 40 Feet Assistive device: Rolling walker (2 wheeled) Gait Pattern/deviations: Step-to pattern;Decreased stance  time - left;Decreased step length - right;Decreased stride length;Decreased weight shift to left;Wide base of support Gait velocity: decreased   General Gait Details: cues for safe step pattern and proxmity to RW, assist with manual facilitation of Lt knee extension and assist to manage/position RW  Stairs            Wheelchair Mobility    Modified Rankin (Stroke Patients Only)       Balance Overall balance assessment: Needs assistance Sitting-balance support: Feet supported Sitting balance-Leahy Scale: Good     Standing balance support: Bilateral upper extremity supported;During functional activity Standing balance-Leahy Scale: Poor          Pertinent Vitals/Pain Pain Assessment: 0-10 Pain Score: 10-Worst pain ever Pain Location: Lt knee Pain Descriptors / Indicators: Aching Pain Intervention(s): Limited activity within patient's tolerance;Monitored during session;Patient requesting pain meds-RN notified;Repositioned;Ice applied    Home Living Family/patient expects to be discharged to:: Private residence Living Arrangements: Spouse/significant other Available Help at Discharge: Family Type of Home: House Home Access: Stairs to enter Entrance Stairs-Rails: Lawyer of Steps: 3 Home Layout: One level Home Equipment: Cane - single point      Prior Function Level of Independence: Independent         Comments: pt using SPC occasionally to help with pain     Hand Dominance   Dominant Hand: Right    Extremity/Trunk Assessment   Upper Extremity Assessment Upper Extremity Assessment: Overall WFL for tasks assessed    Lower Extremity Assessment Lower Extremity Assessment: LLE deficits/detail LLE Deficits / Details: pt with 4/5 for quad sterngth with MMT LLE: Unable to fully assess due to pain  LLE Sensation: WNL LLE Coordination: WNL    Cervical / Trunk Assessment Cervical / Trunk Assessment: Normal  Communication    Communication: No difficulties  Cognition Arousal/Alertness: Awake/alert Behavior During Therapy: WFL for tasks assessed/performed Overall Cognitive Status: Within Functional Limits for tasks assessed         General Comments      Exercises Total Joint Exercises Ankle Circles/Pumps: AROM;Both;10 reps;Seated   Assessment/Plan    PT Assessment Patient needs continued PT services  PT Problem List Decreased strength;Decreased range of motion;Decreased balance;Decreased activity tolerance;Decreased mobility;Decreased knowledge of use of DME       PT Treatment Interventions DME instruction;Stair training;Therapeutic activities;Balance training;Gait training;Functional mobility training;Therapeutic exercise;Patient/family education    PT Goals (Current goals can be found in the Care Plan section)  Acute Rehab PT Goals Patient Stated Goal: to get back to walking independently PT Goal Formulation: With patient Time For Goal Achievement: 04/28/19 Potential to Achieve Goals: Good    Frequency 7X/week    AM-PAC PT "6 Clicks" Mobility  Outcome Measure Help needed turning from your back to your side while in a flat bed without using bedrails?: A Little Help needed moving from lying on your back to sitting on the side of a flat bed without using bedrails?: A Little Help needed moving to and from a bed to a chair (including a wheelchair)?: A Little Help needed standing up from a chair using your arms (e.g., wheelchair or bedside chair)?: A Little Help needed to walk in hospital room?: A Little Help needed climbing 3-5 steps with a railing? : A Lot 6 Click Score: 17    End of Session Equipment Utilized During Treatment: Gait belt Activity Tolerance: Patient tolerated treatment well Patient left: in chair;with call bell/phone within reach;with chair alarm set Nurse Communication: Mobility status PT Visit Diagnosis: Muscle weakness (generalized) (M62.81);Difficulty in walking, not  elsewhere classified (R26.2)    Time: 1610-9604 PT Time Calculation (min) (ACUTE ONLY): 30 min   Charges:   PT Evaluation $PT Eval Low Complexity: 1 Low PT Treatments $Gait Training: 8-22 mins       Wynn Maudlin, DPT Physical Therapist with [facility] [phone]  04/21/2019 7:12 PM

## 2019-04-21 NOTE — Anesthesia Procedure Notes (Signed)
Spinal  Patient location during procedure: OR Start time: 04/21/2019 10:40 AM End time: 04/21/2019 10:48 AM Reason for block: at surgeon's request Staffing Performed: resident/CRNA  Resident/CRNA: [place], CRNA Preanesthetic Checklist Completed: patient identified, IV checked, site marked, risks and benefits discussed, surgical consent, monitors and equipment checked, pre-op evaluation and timeout performed Spinal Block Patient position: sitting Prep: DuraPrep Patient monitoring: heart rate, continuous pulse ox and blood pressure Approach: midline Location: L2-3 Injection technique: single-shot Needle Needle type: Pencan  Needle gauge: 24 G Needle length: 9 cm Assessment Sensory level: T4 Additional Notes Expiration of kit checked and confirmed. Last dose of Eliquis greater than 72 hours per patient. Patient tolerated procedure well,without complications with noted clear CSF. Loss of motor and sensory on exam post injection. Dr Lissa Hoard present for procedure.

## 2019-04-21 NOTE — Interval H&P Note (Signed)
History and Physical Interval Note:  04/21/2019 9:52 AM  Christina Fuller  has presented today for surgery, with the diagnosis of Left knee osteoarthritis.  The various methods of treatment have been discussed with the patient and family. After consideration of risks, benefits and other options for treatment, the patient has consented to  Procedure(s) with comments: TOTAL KNEE ARTHROPLASTY (Left) - adductor canal as a surgical intervention.  The patient's history has been reviewed, patient examined, no change in status, stable for surgery.  I have reviewed the patient's chart and labs.  Questions were answered to the patient's satisfaction.     Sabreen Kitchen ANDREW

## 2019-04-22 DIAGNOSIS — M1712 Unilateral primary osteoarthritis, left knee: Secondary | ICD-10-CM | POA: Diagnosis not present

## 2019-04-22 NOTE — Progress Notes (Signed)
Discharge instructions discussed with patient, verbalized agreement and understanding 

## 2019-04-22 NOTE — Progress Notes (Signed)
Physical Therapy Treatment Patient Details Name: Christina Fuller MRN: [id number] DOB: [birth year] Today's Date: 04/22/2019    History of Present Illness Patient is 54 y.o. female s/p Lt TKA on 04/21/19 with PMH significant for HTN, GERD, colon cancer, OA.    PT Comments    Pt requiring increased time for all tasks but progressing well with mobility.  Pt reviewed home therex written program, ambulated in hall and navigated stairs with assist.  Pt with c/o mild intermittent dizziness - BP 136/81 - RN aware.   Follow Up Recommendations  Follow surgeon's recommendation for DC plan and follow-up therapies     Equipment Recommendations  Rolling walker with 5" wheels;3in1 (PT)    Recommendations for Other Services       Precautions / Restrictions Precautions Precautions: Fall Restrictions Weight Bearing Restrictions: No    Mobility  Bed Mobility Overal bed mobility: Needs Assistance Bed Mobility: Supine to Sit;Sit to Supine     Supine to sit: Supervision Sit to supine: Supervision   General bed mobility comments: pt self cueing for sequencing  Transfers Overall transfer level: Needs assistance Equipment used: Rolling walker (2 wheeled) Transfers: Sit to/from Stand Sit to Stand: Min guard;Supervision         General transfer comment: min cues for use of UEs to self assist  Ambulation/Gait Ambulation/Gait assistance: Min guard;Supervision Gait Distance (Feet): 65 Feet Assistive device: Rolling walker (2 wheeled) Gait Pattern/deviations: Step-to pattern;Decreased step length - right;Decreased step length - left;Shuffle;Trunk flexed;Antalgic Gait velocity: decreased   General Gait Details: cues for safe step pattern and proxmity to RW   Stairs Stairs: Yes Stairs assistance: Min assist Stair Management: No rails;One rail Right;Step to pattern;Forwards;Backwards;With crutches;With walker Number of Stairs: 7 General stair comments: single step twice bkwd for stool  into high bed; 5 stairs with rail and crutch; cues for sequence and foot/crutch placement   Wheelchair Mobility    Modified Rankin (Stroke Patients Only)       Balance Overall balance assessment: Needs assistance Sitting-balance support: Feet supported Sitting balance-Leahy Scale: Good     Standing balance support: No upper extremity supported Standing balance-Leahy Scale: Fair                              Cognition Arousal/Alertness: Awake/alert Behavior During Therapy: WFL for tasks assessed/performed Overall Cognitive Status: Within Functional Limits for tasks assessed                                        Exercises      General Comments        Pertinent Vitals/Pain Pain Assessment: 0-10 Pain Score: 8  Pain Location: Lt knee Pain Descriptors / Indicators: Aching;Sore Pain Intervention(s): Limited activity within patient's tolerance;Monitored during session;Premedicated before session;Ice applied    Home Living                      Prior Function            PT Goals (current goals can now be found in the care plan section) Acute Rehab PT Goals Patient Stated Goal: to get back to walking independently PT Goal Formulation: With patient Time For Goal Achievement: 04/28/19 Potential to Achieve Goals: Good Progress towards PT goals: Progressing toward goals    Frequency    7X/week  PT Plan Current plan remains appropriate    Co-evaluation              AM-PAC PT "6 Clicks" Mobility   Outcome Measure  Help needed turning from your back to your side while in a flat bed without using bedrails?: None Help needed moving from lying on your back to sitting on the side of a flat bed without using bedrails?: A Little Help needed moving to and from a bed to a chair (including a wheelchair)?: A Little Help needed standing up from a chair using your arms (e.g., wheelchair or bedside chair)?: A Little Help needed  to walk in hospital room?: A Little Help needed climbing 3-5 steps with a railing? : A Little 6 Click Score: 19    End of Session Equipment Utilized During Treatment: Gait belt Activity Tolerance: Patient tolerated treatment well Patient left: in bed;with call bell/phone within reach Nurse Communication: Mobility status PT Visit Diagnosis: Muscle weakness (generalized) (M62.81);Difficulty in walking, not elsewhere classified (R26.2)     Time: 4696-2952 PT Time Calculation (min) (ACUTE ONLY): 48 min  Charges:  $Gait Training: 23-37 mins $Therapeutic Activity: 8-22 mins                     Mauro Kaufmann PT Acute Rehabilitation Services Pager [phone] Office [phone]    Christina Fuller 04/22/2019, 4:44 PM

## 2019-04-22 NOTE — TOC Progression Note (Signed)
Transition of Care [facility]) - Progression Note    Patient Details  Name: Christina Fuller MRN: [id number] Date of Birth: 08-03-1965  Transition of Care [facility]) CM/SW Contact  Joaquin Courts, RN Phone Number: 04/22/2019, 11:33 AM  Clinical Narrative:    CM spoke with patient. [facility] up with [facility] for HHPT. [facility] to deliver rolling walker and 3-in-1 to bedside for home use.    Expected Discharge Plan: [place] Barriers to Discharge: No Barriers Identified  Expected Discharge Plan and Services Expected Discharge Plan: [place]   Discharge Planning Services: CM Consult Post Acute Care Choice: [place] arrangements for the past 2 months: Single Family Home Expected Discharge Date: 04/22/19               DME Arranged: Gilford Rile rolling, 3-N-1 DME Agency: [employer] Date DME Agency Contacted: 04/22/19 Time DME Agency Contacted: [id number] Representative spoke with at DME Agency: [place]: PT [place]: [place] ([place]) Date [place]: 04/22/19 Time [place]: 1131 Representative spoke with at [place]: [place] ([place]) Interventions    Readmission Risk Interventions No flowsheet data found.

## 2019-04-22 NOTE — Discharge Instructions (Addendum)
TOTAL KNEE REPLACEMENT POSTOPERATIVE DIRECTIONS    Knee Rehabilitation, Guidelines Following Surgery  Results after knee surgery are often greatly improved when you follow the exercise, range of motion and muscle strengthening exercises prescribed by your doctor. Safety measures are also important to protect the knee from further injury. If any of these exercises cause you to have increased pain or swelling in your knee joint, decrease the amount until you are comfortable again and slowly increase them. If you have problems or questions, call your caregiver or physical therapist for advice.   HOME CARE INSTRUCTIONS  . Remove items at home which could result in a fall. This includes throw rugs or furniture in walking pathways.  . ICE to the affected knee as much as tolerated. Icing helps control swelling. If the swelling is well controlled you will be more comfortable and rehab easier. Continue to use ice on the knee for pain and swelling from surgery. You may notice swelling that will progress down to the foot and ankle. This is normal after surgery. Elevate the leg when you are not up walking on it.    . Continue to use the breathing machine which will help keep your temperature down. It is common for your temperature to cycle up and down following surgery, especially at night when you are not up moving around and exerting yourself. The breathing machine keeps your lungs expanded and your temperature down. . Do not place pillow under the operative knee, focus on keeping the knee straight while resting  DIET You may resume your previous home diet once you are discharged from the hospital.  DRESSING / Center / SHOWERING . Remove the bulky dressing 2 days following surgery, this will include an ACE wrap and rolled gauze. Leave the waterproof adhesive bandage in place until your first follow-up appointment following surgery, but do not submerge the incision under water.  ACTIVITY For  the first 5 days, the key is rest and control of pain and swelling . Do your home exercises twice a day starting on post-operative day 3. On the days you go to physical therapy, just do the home exercises once that day. . You should rest, ice and elevate the leg for 50 minutes out of every hour. Get up and walk/stretch for 10 minutes per hour. After 5 days you can increase your activity slowly as tolerated. . Walk with your walker as instructed. Use the walker until you are comfortable transitioning to a cane. Walk with the cane in the opposite hand of the operative leg. You may discontinue the cane once you are comfortable and walking steadily. . Avoid periods of inactivity such as sitting longer than an hour when not asleep. This helps prevent blood clots.  . You may discontinue the knee immobilizer once you are able to perform a straight leg raise while lying down. . You may resume a sexual relationship in one month or when given the OK by your doctor.  . You may return to work once you are cleared by your doctor.  . Do not drive a car for 6 weeks or until released by you surgeon.  . Do not drive while taking narcotics.  TED HOSE STOCKINGS Wear the elastic stockings on both legs for three weeks following surgery during the day. You may remove them at night for sleeping.  WEIGHT BEARING Weight bearing as tolerated with assist device (walker, cane, etc) as directed, use it as long as suggested by your surgeon  or therapist, typically at least 4-6 weeks.  POSTOPERATIVE CONSTIPATION PROTOCOL Constipation - defined medically as fewer than three stools per week and severe constipation as less than one stool per week.  One of the most common issues patients have following surgery is constipation.  Even if you have a regular bowel pattern at home, your normal regimen is likely to be disrupted due to multiple reasons following surgery.  Combination of anesthesia, postoperative narcotics, change in  appetite and fluid intake all can affect your bowels.  In order to avoid complications following surgery, here are some recommendations in order to help you during your recovery period.  . Colace (docusate) - Pick up an over-the-counter form of Colace or another stool softener and take twice a day as long as you are requiring postoperative pain medications.  Take with a full glass of water daily.  If you experience loose stools or diarrhea, hold the colace until you stool forms back up. If your symptoms do not get better within 1 week or if they get worse, check with your doctor. . Dulcolax (bisacodyl) - Pick up over-the-counter and take as directed by the product packaging as needed to assist with the movement of your bowels.  Take with a full glass of water.  Use this product as needed if not relieved by Colace only.  . MiraLax (polyethylene glycol) - Pick up over-the-counter to have on hand. MiraLax is a solution that will increase the amount of water in your bowels to assist with bowel movements.  Take as directed and can mix with a glass of water, juice, soda, coffee, or tea. Take if you go more than two days without a movement. Do not use MiraLax more than once per day. Call your doctor if you are still constipated or irregular after using this medication for 7 days in a row.  If you continue to have problems with postoperative constipation, please contact the office for further assistance and recommendations.  If you experience "the worst abdominal pain ever" or develop nausea or vomiting, please contact the office immediatly for further recommendations for treatment.  ITCHING If you experience itching with your medications, try taking only a single pain pill, or even half a pain pill at a time.  You can also use Benadryl over the counter for itching or also to help with sleep.   MEDICATIONS See your medication summary on the "After Visit Summary" that the nursing staff will review with you prior  to discharge.  You may have some home medications which will be placed on hold until you complete the course of blood thinner medication.  It is important for you to complete the blood thinner medication as prescribed by your surgeon.  Continue your approved medications as instructed at time of discharge.  PRECAUTIONS . If you experience chest pain or shortness of breath - call 911 immediately for transfer to the hospital emergency department.  . If you develop a fever greater that 101 F, purulent drainage from wound, increased redness or drainage from wound, foul odor from the wound/dressing, or calf pain - CONTACT YOUR SURGEON.                                                   FOLLOW-UP APPOINTMENTS Make sure you keep all of your appointments after your operation with your surgeon and  caregivers. You should call the office at the above phone number and make an appointment for approximately two weeks after the date of your surgery or on the date instructed by your surgeon outlined in the "After Visit Summary".  RANGE OF MOTION AND STRENGTHENING EXERCISES  Rehabilitation of the knee is important following a knee injury or an operation. After just a few days of immobilization, the muscles of the thigh which control the knee become weakened and shrink (atrophy). Knee exercises are designed to build up the tone and strength of the thigh muscles and to improve knee motion. Often times heat used for twenty to thirty minutes before working out will loosen up your tissues and help with improving the range of motion but do not use heat for the first two weeks following surgery. These exercises can be done on a training (exercise) mat, on the floor, on a table or on a bed. Use what ever works the best and is most comfortable for you Knee exercises include:  . Leg Lifts - While your knee is still immobilized in a splint or cast, you can do straight leg raises. Lift the leg to 60 degrees, hold for 3 sec, and slowly  lower the leg. Repeat 10-20 times 2-3 times daily. Perform this exercise against resistance later as your knee gets better.  Javier Docker and Hamstring Sets - Tighten up the muscle on the front of the thigh (Quad) and hold for 5-10 sec. Repeat this 10-20 times hourly. Hamstring sets are done by pushing the foot backward against an object and holding for 5-10 sec. Repeat as with quad sets.   Leg Slides: Lying on your back, slowly slide your foot toward your buttocks, bending your knee up off the floor (only go as far as is comfortable). Then slowly slide your foot back down until your leg is flat on the floor again.  Angel Wings: Lying on your back spread your legs to the side as far apart as you can without causing discomfort.  A rehabilitation program following serious knee injuries can speed recovery and prevent re-injury in the future due to weakened muscles. Contact your doctor or a physical therapist for more information on knee rehabilitation.   IF YOU ARE TRANSFERRED TO A SKILLED REHAB FACILITY If the patient is transferred to a skilled rehab facility following release from the hospital, a list of the current medications will be sent to the facility for the patient to continue.  When discharged from the skilled rehab facility, please have the facility set up the patient's [place] prior to being released. Also, the skilled facility will be responsible for providing the patient with their medications at time of release from the facility to include their pain medication, the muscle relaxants, and their blood thinner medication. If the patient is still at the rehab facility at time of the two week follow up appointment, the skilled rehab facility will also need to assist the patient in arranging follow up appointment in our office and any transportation needs.  MAKE SURE YOU:  . Understand these instructions.  . Get help right away if you are not doing well or get worse.    Pick up  stool softner and laxative for home use following surgery while on pain medications. Do not submerge incision under water. Please use good hand washing techniques while changing dressing each day. May shower starting three days after surgery. Please use a clean towel to pat the incision dry following showers.  Continue to use ice for pain and swelling after surgery. Do not use any lotions or creams on the incision until instructed by your surgeon.

## 2019-04-22 NOTE — Progress Notes (Signed)
   Subjective: 1 Day Post-Op Procedure(s) (LRB): TOTAL KNEE ARTHROPLASTY (Left) Patient reports pain as moderate.   Patient seen in rounds for Dr. Theda Sers. Patient is well, and has had no acute complaints or problems other than pain in the left knee. Denies chest pain, SOB, or calf pain. Foley catheter to be removed this AM. No issues overnight.  We will continue therapy today, ambulated 40' yesterday.   Objective: Vital signs in last 24 hours: Temp:  [97.5 F (36.4 C)-98.2 F (36.8 C)] 97.5 F (36.4 C) (01/23 0457) Pulse Rate:  [42-64] 56 (01/23 0457) Resp:  [9-23] 17 (01/23 0457) BP: (98-138)/(51-73) 129/73 (01/23 0457) SpO2:  [95 %-100 %] 98 % (01/23 0457) Weight:  [105.2 kg] 105.2 kg (01/22 0825)  Intake/Output from previous day:  Intake/Output Summary (Last 24 hours) at 04/22/2019 0811 Last data filed at 04/22/2019 0457 Gross per 24 hour  Intake 2000 ml  Output 1245 ml  Net 755 ml    Labs: Recent Labs    04/20/19 1537  HGB 11.8*   Recent Labs    04/20/19 1537  WBC 5.4  RBC 4.36  HCT 37.3  PLT 293   Recent Labs    04/20/19 1537  NA 141  K 4.3  CL 109  CO2 26  BUN 10  CREATININE 0.88  GLUCOSE 93  CALCIUM 8.8*   Recent Labs    04/20/19 1537  INR 1.0    Exam: General - Patient is Alert and Oriented Extremity - Neurologically intact Neurovascular intact Sensation intact distally Dorsiflexion/Plantar flexion intact Dressing - dressing C/D/I Motor Function - intact, moving foot and toes well on exam.   Past Medical History:  Diagnosis Date  . Arthritis   . Carpal tunnel syndrome   . Colon cancer ([place]) 2004  . Dysrhythmia   . GERD (gastroesophageal reflux disease)   . H/O hiatal hernia   . Herniated nucleus pulposus   . Hypertension   . Paroxysmal atrial fibrillation ([place])    evaluated by [facility]     Assessment/Plan: 1 Day Post-Op Procedure(s) (LRB): TOTAL KNEE ARTHROPLASTY (Left) Active Problems:   Osteoarthritis of left  knee  Estimated body mass index is 37.43 kg/m as calculated from the following:   Height as of this encounter: 5\' 6"  (1.676 m).   Weight as of this encounter: 105.2 kg. Advance diet Up with therapy D/C IV fluids  DVT Prophylaxis - Eliquis Weight bearing as tolerated. D/C O2 and pulse ox and try on room air. Hemovac pulled without difficulty, will continue therapy today.  Plan is to go Home after hospital stay.  Plan for discharge later today with HHPT if progresses with therapy and meeting her goals. Follow-up in the office in 2 weeks with Dr. Theda Sers.  Theresa Duty, PA-C Orthopedic Surgery 04/22/2019, 8:11 AM

## 2019-04-22 NOTE — Progress Notes (Signed)
Physical Therapy Treatment Patient Details Name: Christina Fuller MRN: [id number] DOB: 02/13/1966 Today's Date: 04/22/2019    History of Present Illness Patient is 54 y.o. female s/p Lt TKA on 04/21/19 with PMH significant for HTN, GERD, colon cancer, OA.    PT Comments    Pt requiring increased time for all activities but progressing with mobility.   Follow Up Recommendations  Follow surgeon's recommendation for DC plan and follow-up therapies     Equipment Recommendations  Rolling walker with 5" wheels;3in1 (PT)    Recommendations for Other Services       Precautions / Restrictions Precautions Precautions: Fall Restrictions Weight Bearing Restrictions: No    Mobility  Bed Mobility Overal bed mobility: Needs Assistance Bed Mobility: Supine to Sit     Supine to sit: Min guard     General bed mobility comments: cues for sequencing and min guard for LE mobility  Transfers Overall transfer level: Needs assistance Equipment used: Rolling walker (2 wheeled) Transfers: Sit to/from Stand Sit to Stand: Min assist         General transfer comment: cues for hand placement and technique with RW, no overt LOB noted.  Ambulation/Gait Ambulation/Gait assistance: Min assist;Min guard Gait Distance (Feet): 123 Feet Assistive device: Rolling walker (2 wheeled) Gait Pattern/deviations: Step-to pattern;Decreased step length - right;Decreased step length - left;Shuffle;Trunk flexed;Antalgic Gait velocity: decreased   General Gait Details: cues for safe step pattern and proxmity to [employer] Mobility    Modified Rankin (Stroke Patients Only)       Balance Overall balance assessment: Needs assistance Sitting-balance support: Feet supported Sitting balance-Leahy Scale: Good     Standing balance support: Bilateral upper extremity supported;During functional activity Standing balance-Leahy Scale: Poor                               Cognition Arousal/Alertness: Awake/alert Behavior During Therapy: WFL for tasks assessed/performed Overall Cognitive Status: Within Functional Limits for tasks assessed                                        Exercises Total Joint Exercises Ankle Circles/Pumps: AROM;Both;Seated;15 reps Quad Sets: AROM;10 reps;Both;Supine Heel Slides: AAROM;Left;15 reps;Supine Straight Leg Raises: AAROM;AROM;Left;15 reps;Supine Goniometric ROM: AAROM L knee -5 - 50    General Comments        Pertinent Vitals/Pain Pain Assessment: 0-10 Pain Score: 8  Pain Location: Lt knee Pain Descriptors / Indicators: Aching;Sore Pain Intervention(s): Limited activity within patient's tolerance;Monitored during session;Premedicated before session;Patient requesting pain meds-RN notified;Ice applied    Home Living                      Prior Function            PT Goals (current goals can now be found in the care plan section) Acute Rehab PT Goals Patient Stated Goal: to get back to walking independently PT Goal Formulation: With patient Time For Goal Achievement: 04/28/19 Potential to Achieve Goals: Good Progress towards PT goals: Progressing toward goals    Frequency    7X/week      PT Plan Current plan remains appropriate    Co-evaluation              AM-PAC PT "6  Clicks" Mobility   Outcome Measure  Help needed turning from your back to your side while in a flat bed without using bedrails?: A Little Help needed moving from lying on your back to sitting on the side of a flat bed without using bedrails?: A Little Help needed moving to and from a bed to a chair (including a wheelchair)?: A Little Help needed standing up from a chair using your arms (e.g., wheelchair or bedside chair)?: A Little Help needed to walk in hospital room?: A Little Help needed climbing 3-5 steps with a railing? : A Lot 6 Click Score: 17    End of Session Equipment Utilized  During Treatment: Gait belt Activity Tolerance: Patient tolerated treatment well Patient left: in chair;with call bell/phone within reach;with chair alarm set Nurse Communication: Mobility status PT Visit Diagnosis: Muscle weakness (generalized) (M62.81);Difficulty in walking, not elsewhere classified (R26.2)     Time: 4098-1191 PT Time Calculation (min) (ACUTE ONLY): 37 min  Charges:  $Gait Training: 8-22 mins $Therapeutic Exercise: 8-22 mins                     Mauro Kaufmann PT Acute Rehabilitation Services Pager [phone] Office [phone]    [facility] 04/22/2019, 12:43 PM

## 2019-04-24 ENCOUNTER — Encounter: Payer: Self-pay | Admitting: *Deleted

## 2019-04-24 NOTE — Discharge Summary (Signed)
Physician Discharge Summary  Patient ID: Christina Fuller MRN: [id number] DOB/AGE: 1965-11-19 54 y.o.  Admit date: 04/21/2019 Discharge date: 04/24/2019  Admission Diagnoses: Left knee osteoarthritis  Discharge Diagnoses:  Active Problems:   Osteoarthritis of left knee   Discharged Condition: Stable  Hospital Course: Patient was admitted on January 22 for a left total knee arthroplasty, due to end stage osteoarthritis. Patient tolerated surgery well. Was sent to the PACU in stable condition. Patient was sent up to the postop floor in stable condition as well. Patient was seen day of surgery by PT and tolerated it well. She was able to get up and ambulate with minimal difficulty. Patient had no events over night. Seen in rounds postop day 1 with complaints of pain only in the left knee. Worked with PT again on postop day 1 and was able to be discharged home. Patient was sent home with pain medications and was advised to restarted her Eliquis as soon as she returned home. She was also given one in the hospital 12 hrs after hemostasis. Patient was advised to follow up in the office 2 weeks following surgery.   Consults: None  Significant Diagnostic Studies: none  Treatments: antibiotics: Ancef; pain medications: oxycodone, Tramadol, Tylenol; DVT prophylaxis: Eliquis  Discharge Exam: Blood pressure (!) 116/50, pulse (!) 59, temperature 98 F (36.7 C), temperature source Oral, resp. rate 18, height 5\' 6"  (1.676 m), weight 105.2 kg, SpO2 100 %. General appearance: alert, cooperative, appears stated age and no distress Extremities: extremities normal, atraumatic, no cyanosis or edema and Homans sign is negative, no sign of DVT Pulses: 2+ and symmetric Skin: Skin color, texture, turgor normal. No rashes or lesions Neurologic: Alert and oriented X 3, normal strength and tone. Normal symmetric reflexes. Normal coordination and gait Incision/Wound: Dressings clean dry and intact Hemovac site  clear  Disposition: Discharge disposition: 01-Home or Self Care       Discharge Instructions    Call MD / Call 911   Complete by: As directed    If you experience chest pain or shortness of breath, CALL 911 and be transported to the hospital emergency room.  If you develope a fever above 101 F, pus (white drainage) or increased drainage or redness at the wound, or calf pain, call your surgeon's office.   Call MD / Call 911   Complete by: As directed    If you experience chest pain or shortness of breath, CALL 911 and be transported to the hospital emergency room.  If you develope a fever above 101 F, pus (white drainage) or increased drainage or redness at the wound, or calf pain, call your surgeon's office.   Constipation Prevention   Complete by: As directed    Drink plenty of fluids.  Prune juice may be helpful.  You may use a stool softener, such as Colace (over the counter) 100 mg twice a day.  Use MiraLax (over the counter) for constipation as needed.   Constipation Prevention   Complete by: As directed    Drink plenty of fluids.  Prune juice may be helpful.  You may use a stool softener, such as Colace (over the counter) 100 mg twice a day.  Use MiraLax (over the counter) for constipation as needed.   Diet - low sodium heart healthy   Complete by: As directed    Diet - low sodium heart healthy   Complete by: As directed    Discharge instructions   Complete by: As directed  Dr. Eugenia Mcalpine [facility] [address], [address] [place], [place] [address] [phone]  TOTAL KNEE REPLACEMENT POSTOPERATIVE DIRECTIONS  Knee Rehabilitation, Guidelines Following Surgery  Results after knee surgery are often greatly improved when you follow the exercise, range of motion and muscle strengthening exercises prescribed by your doctor. Safety measures are also important to protect the knee from further injury. Any time any of these exercises cause you to have increased pain or  swelling in your knee joint, decrease the amount until you are comfortable again and slowly increase them. If you have problems or questions, call your caregiver or physical therapist for advice.   HOME CARE INSTRUCTIONS  Remove items at home which could result in a fall. This includes throw rugs or furniture in walking pathways.  ICE to the affected knee every three hours for 30 minutes at a time and then as needed for pain and swelling.  Continue to use ice on the knee for pain and swelling from surgery. You may notice swelling that will progress down to the foot and ankle.  This is normal after surgery.  Elevate the leg when you are not up walking on it.   Continue to use the breathing machine which will help keep your temperature down.  It is common for your temperature to cycle up and down following surgery, especially at night when you are not up moving around and exerting yourself.  The breathing machine keeps your lungs expanded and your temperature down. Do not place pillow under knee, focus on keeping the knee straight while resting   DIET You may resume your previous home diet once your are discharged from the hospital.  DRESSING / WOUND CARE / SHOWERING Keep the surgical dressing until follow up.  The dressing is water proof, but you need to put extra covering over it like plastic wrap.  IF THE DRESSING FALLS OFF or the wound gets wet inside, change the dressing with sterile gauze.  Please use good hand washing techniques before changing the dressing.  Do not use any lotions or creams on the incision until instructed by your surgeon.   You may start showering once you are discharged home but do not submerge the incision under water. Just pat the incision dry and apply a dry gauze dressing on daily. Change the surgical dressing daily and reapply a dry dressing each time.  ACTIVITY Walk with your walker as instructed. Use walker as long as suggested by your caregivers. Avoid periods of  inactivity such as sitting longer than an hour when not asleep. This helps prevent blood clots.  You may resume a sexual relationship in one month or when given the OK by your doctor.  You may return to work once you are cleared by your doctor.  Do not drive a car for 6 weeks or until released by you surgeon.  Do not drive while taking narcotics.  WEIGHT BEARING Weight bearing as tolerated with assist device (walker, cane, etc) as directed, use it as long as suggested by your surgeon or therapist, typically at least 4-6 weeks.  POSTOPERATIVE CONSTIPATION PROTOCOL Constipation - defined medically as fewer than three stools per week and severe constipation as less than one stool per week.  One of the most common issues patients have following surgery is constipation.  Even if you have a regular bowel pattern at home, your normal regimen is likely to be disrupted due to multiple reasons following surgery.  Combination of anesthesia, postoperative narcotics, change in  appetite and fluid intake all can affect your bowels.  In order to avoid complications following surgery, here are some recommendations in order to help you during your recovery period.  Colace (docusate) - Pick up an over-the-counter form of Colace or another stool softener and take twice a day as long as you are requiring postoperative pain medications.  Take with a full glass of water daily.  If you experience loose stools or diarrhea, hold the colace until you stool forms back up.  If your symptoms do not get better within 1 week or if they get worse, check with your doctor.  Dulcolax (bisacodyl) - Pick up over-the-counter and take as directed by the product packaging as needed to assist with the movement of your bowels.  Take with a full glass of water.  Use this product as needed if not relieved by Colace only.   MiraLax (polyethylene glycol) - Pick up over-the-counter to have on hand.  MiraLax is a solution that will increase the  amount of water in your bowels to assist with bowel movements.  Take as directed and can mix with a glass of water, juice, soda, coffee, or tea.  Take if you go more than two days without a movement. Do not use MiraLax more than once per day. Call your doctor if you are still constipated or irregular after using this medication for 7 days in a row.  If you continue to have problems with postoperative constipation, please contact the office for further assistance and recommendations.  If you experience "the worst abdominal pain ever" or develop nausea or vomiting, please contact the office immediatly for further recommendations for treatment.  ITCHING  If you experience itching with your medications, try taking only a single pain pill, or even half a pain pill at a time.  You can also use Benadryl over the counter for itching or also to help with sleep.   TED HOSE STOCKINGS Wear the elastic stockings on both legs for three weeks following surgery during the day but you may remove then at night for sleeping.  Okay to remove ACE in 3 days, put TED on after  MEDICATIONS See your medication summary on the "After Visit Summary" that the nursing staff will review with you prior to discharge.  You may have some home medications which will be placed on hold until you complete the course of blood thinner medication.  It is important for you to complete the blood thinner medication as prescribed by your surgeon.  Continue your approved medications as instructed at time of discharge.  Please restart Eliquis  PRECAUTIONS If you experience chest pain or shortness of breath - call 911 immediately for transfer to the hospital emergency department.  If you develop a fever greater that 101 F, purulent drainage from wound, increased redness or drainage from wound, foul odor from the wound/dressing, or calf pain - CONTACT YOUR SURGEON.                                                   FOLLOW-UP APPOINTMENTS Make  sure you keep all of your appointments after your operation with your surgeon and caregivers. You should call the office at the above phone number and make an appointment for approximately two weeks after the date of your surgery or on the date instructed by your surgeon  outlined in the "After Visit Summary".   RANGE OF MOTION AND STRENGTHENING EXERCISES  Rehabilitation of the knee is important following a knee injury or an operation. After just a few days of immobilization, the muscles of the thigh which control the knee become weakened and shrink (atrophy). Knee exercises are designed to build up the tone and strength of the thigh muscles and to improve knee motion. Often times heat used for twenty to thirty minutes before working out will loosen up your tissues and help with improving the range of motion but do not use heat for the first two weeks following surgery. These exercises can be done on a training (exercise) mat, on the floor, on a table or on a bed. Use what ever works the best and is most comfortable for you Knee exercises include:  Leg Lifts - While your knee is still immobilized in a splint or cast, you can do straight leg raises. Lift the leg to 60 degrees, hold for 3 sec, and slowly lower the leg. Repeat 10-20 times 2-3 times daily. Perform this exercise against resistance later as your knee gets better.  Quad and Hamstring Sets - Tighten up the muscle on the front of the thigh (Quad) and hold for 5-10 sec. Repeat this 10-20 times hourly. Hamstring sets are done by pushing the foot backward against an object and holding for 5-10 sec. Repeat as with quad sets.  Leg Slides: Lying on your back, slowly slide your foot toward your buttocks, bending your knee up off the floor (only go as far as is comfortable). Then slowly slide your foot back down until your leg is flat on the floor again. Angel Wings: Lying on your back spread your legs to the side as far apart as you can without causing  discomfort.  A rehabilitation program following serious knee injuries can speed recovery and prevent re-injury in the future due to weakened muscles. Contact your doctor or a physical therapist for more information on knee rehabilitation.   IF YOU ARE TRANSFERRED TO A SKILLED REHAB FACILITY If the patient is transferred to a skilled rehab facility following release from the hospital, a list of the current medications will be sent to the facility for the patient to continue.  When discharged from the skilled rehab facility, please have the facility set up the patient's Home Health Physical Therapy prior to being released. Also, the skilled facility will be responsible for providing the patient with their medications at time of release from the facility to include their pain medication, the muscle relaxants, and their blood thinner medication. If the patient is still at the rehab facility at time of the two week follow up appointment, the skilled rehab facility will also need to assist the patient in arranging follow up appointment in our office and any transportation needs.  MAKE SURE YOU:  Understand these instructions.  Get help right away if you are not doing well or get worse.    Pick up stool softner and laxative for home use following surgery while on pain medications. Do not submerge incision under water. Please use good hand washing techniques while changing dressing each day. May shower starting three days after surgery. Please use a clean towel to pat the incision dry following showers. Continue to use ice for pain and swelling after surgery. Do not use any lotions or creams on the incision until instructed by your surgeon.   Do not put a pillow under the knee. Place it under the  heel.   Complete by: As directed    Do not put a pillow under the knee. Place it under the heel.   Complete by: As directed    Driving restrictions   Complete by: As directed    No driving for two weeks    Driving restrictions   Complete by: As directed    No driving for two weeks   TED hose   Complete by: As directed    Use stockings (TED hose) for three weeks on both leg(s).  You may remove them at night for sleeping.   TED hose   Complete by: As directed    Use stockings (TED hose) for three weeks on both leg(s).  You may remove them at night for sleeping.   Weight bearing as tolerated   Complete by: As directed    Weight bearing as tolerated   Complete by: As directed      Allergies as of 04/22/2019   No Known Allergies     Medication List    TAKE these medications   atenolol 25 MG tablet Commonly known as: TENORMIN Take 25 mg by mouth daily.   Eliquis 5 MG Tabs tablet Generic drug: apixaban Take 5 mg by mouth 2 (two) times daily.   methocarbamol 500 MG tablet Commonly known as: Robaxin Take 1 tablet (500 mg total) by mouth 4 (four) times daily.   ondansetron 4 MG tablet Commonly known as: Zofran Take 1 tablet (4 mg total) by mouth every 8 (eight) hours as needed for nausea or vomiting.   oxyCODONE 5 MG immediate release tablet Commonly known as: Roxicodone Take 1-2 tabs PO q 4-6 hrs prn pain            Discharge Care Instructions  (From admission, onward)         Start     Ordered   04/22/19 0000  Weight bearing as tolerated     04/22/19 0818   04/21/19 0000  Weight bearing as tolerated     04/21/19 1349         Follow-up Information    Eugenia Mcalpine, MD Follow up in 2 week(s).   Specialty: Orthopedic Surgery Contact information: [address] [place] [address] [phone]        [facility] Follow up.   Why: agency will provide home health physical therapy. agency will call you to schedule first visit. Contact information: [address] [place] [address] [phone]           Signed: Cherie Dark, PA-C Orthopedic surgery 04/24/2019, 12:32 PM

## 2019-04-24 NOTE — Anesthesia Postprocedure Evaluation (Signed)
Anesthesia Post Note  Patient: Christina Fuller  Procedure(s) Performed: TOTAL KNEE ARTHROPLASTY (Left Knee)     Patient location during evaluation: PACU Anesthesia Type: Spinal Level of consciousness: awake and alert and oriented Pain management: pain level controlled Vital Signs Assessment: post-procedure vital signs reviewed and stable Respiratory status: spontaneous breathing, nonlabored ventilation and respiratory function stable Cardiovascular status: blood pressure returned to baseline Postop Assessment: no apparent nausea or vomiting Anesthetic complications: no            Brennan Bailey

## 2019-09-06 ENCOUNTER — Other Ambulatory Visit: Payer: Self-pay | Admitting: Neurological Surgery

## 2019-09-06 DIAGNOSIS — M545 Low back pain, unspecified: Secondary | ICD-10-CM

## 2019-10-23 ENCOUNTER — Ambulatory Visit
Admission: RE | Admit: 2019-10-23 | Discharge: 2019-10-23 | Disposition: A | Discharge status: Home / Self Care | Payer: BC Managed Care – PPO | Source: Ambulatory Visit | Attending: Neurological Surgery | Admitting: Neurological Surgery

## 2019-10-23 ENCOUNTER — Other Ambulatory Visit: Payer: Self-pay

## 2019-10-23 DIAGNOSIS — M545 Low back pain, unspecified: Secondary | ICD-10-CM

## 2021-10-13 ENCOUNTER — Encounter (HOSPITAL_BASED_OUTPATIENT_CLINIC_OR_DEPARTMENT_OTHER): Payer: Self-pay | Admitting: Orthopedic Surgery

## 2021-10-13 NOTE — Progress Notes (Signed)
Spoke w/ via phone for pre-op interview--- Christina Fuller needs dos----  EKG. Surgeon orders pending.              Fuller results------ COVID test -----patient states asymptomatic no test needed Arrive at -------0715 NPO after MN NO Solid Food.   Med rec completed Medications to take morning of surgery ----- Atenolol Diabetic medication ----- Patient instructed no nail polish to be worn day of surgery Patient instructed to bring photo id and insurance card day of surgery Patient aware to have Driver (ride ) / caregiver  Husband Christina Fuller  for 24 hours after surgery  Patient Special Instructions ----- Pre-Op special Istructions ----- Dr Greta Doom office notified that pt needs instructions on hold Eliquis for procedure. Patient informed to call if office if she has not heard by Thursday.  Patient verbalized understanding of instructions that were given at this phone interview. Patient denies shortness of breath, chest pain, fever, cough at this phone interview.

## 2021-10-13 NOTE — Progress Notes (Signed)
Per Caryl Pina at [facility], she as well as the patients cardiologist office have spoken with patient and given her instructions regarding holding her Eliquis preop. Patient verbalized understanding.

## 2021-10-17 NOTE — H&P (Signed)
Preoperative History & Physical Exam  Surgeon: Matt Holmes, MD  Diagnosis: Right wrist SL and TFCC tear  Planned Procedure: Procedure(s) (LRB): Right wrist arthroscopy scapholunate and triangular fibrocartilage complex debridement, possible repair (Right)  History of Present Illness:   Patient is a 55 y.o. female with symptoms consistent with Right wrist SL and TFCC tear who presents for surgical intervention. The risks, benefits and alternatives of surgical intervention were discussed and informed consent was obtained prior to surgery.  Past Medical History:  Past Medical History:  Diagnosis Date   Arthritis    Carpal tunnel syndrome    Colon cancer ([place]) 2004   Dysrhythmia    GERD (gastroesophageal reflux disease)    H/O hiatal hernia    Herniated nucleus pulposus    Hypertension    Paroxysmal atrial fibrillation ([place])    evaluated by [facility]    Sleep apnea     Past Surgical History:  Past Surgical History:  Procedure Laterality Date   ABDOMINAL HYSTERECTOMY  2007   BACK SURGERY     CARPAL TUNNEL RELEASE  2011   bilateral   CHOLECYSTECTOMY  2004   JOINT REPLACEMENT Right 2015   Total Knee Replacement   LAMINECTOMY  2011   "lower back"   PARTIAL COLECTOMY  12/2002   "colon cancer"   PORT-A-CATH REMOVAL  2005   left chest   PORTACATH PLACEMENT  2004   left chest   POSTERIOR FUSION LUMBAR SPINE  07/08/11   L5-S1   stomach reconstruction  2005   d/t clot post partial colectomy   TONSILLECTOMY AND ADENOIDECTOMY  1971   TOTAL KNEE ARTHROPLASTY Left 04/21/2019   Procedure: TOTAL KNEE ARTHROPLASTY;  Surgeon: Sydnee Cabal, MD;  Location: [facility];  Service: Orthopedics;  Laterality: Left;  adductor canal    Medications:  Prior to Admission medications   Medication Sig Start Date End Date Taking? Authorizing Provider  apixaban (ELIQUIS) 5 MG TABS tablet Take 5 mg by mouth 2 (two) times daily.    Yes [provider]  atenolol (TENORMIN) 25  MG tablet Take 25 mg by mouth daily. 04/11/19  Yes [provider]  HYDROcodone-acetaminophen (NORCO/VICODIN) 5-325 MG tablet Take 1 tablet by mouth every 6 (six) hours as needed for moderate pain.   Yes [provider]  topiramate (TOPAMAX) 50 MG tablet Take 50 mg by mouth 2 (two) times daily.   Yes [provider]  methocarbamol (ROBAXIN) 500 MG tablet Take 1 tablet (500 mg total) by mouth 4 (four) times daily. 04/21/19   Drue Novel, PA  oxyCODONE (ROXICODONE) 5 MG immediate release tablet Take 1-2 tabs PO q 4-6 hrs prn pain 04/21/19   Elizabeth Sauer R, PA    Allergies:  Patient has no known allergies.  Review of Systems: Negative except per HPI.  Physical Exam: Alert and oriented, NAD Head and neck: no masses, normal alignment CV: pulse intact Pulm: no increased work of breathing, respirations even and unlabored Abdomen: non-distended Extremities: extremities warm and well perfused  LABS: No results found for this or any previous visit (from the past 2160 hour(s)).   Complete History and Physical exam available in the office notes  Christina Fuller

## 2021-10-20 NOTE — Anesthesia Preprocedure Evaluation (Signed)
Anesthesia Evaluation  Patient identified by MRN, date of birth, ID band Patient awake    Reviewed: Allergy & Precautions, NPO status , Patient's Chart, lab work & pertinent test results, reviewed documented beta blocker date and time   History of Anesthesia Complications Negative for: history of anesthetic complications  Airway Mallampati: II  TM Distance: >3 FB Neck ROM: Full    Dental  (+) Dental Advisory Given, Teeth Intact   Pulmonary sleep apnea ,    Pulmonary exam normal        Cardiovascular hypertension, Pt. on home beta blockers and Pt. on medications + dysrhythmias Atrial Fibrillation  Rhythm:Irregular Rate:Bradycardia     Neuro/Psych negative neurological ROS  negative psych ROS   GI/Hepatic Neg liver ROS, hiatal hernia, GERD  Controlled, Hx colon cancer    Endo/Other  negative endocrine ROS  Renal/GU negative Renal ROS     Musculoskeletal  (+) Arthritis ,   Abdominal   Peds  Hematology negative hematology ROS (+)  On eliquis    Anesthesia Other Findings   Reproductive/Obstetrics                            Anesthesia Physical Anesthesia Plan  ASA: 3  Anesthesia Plan: Regional   Post-op Pain Management: Regional block* and Tylenol PO (pre-op)*   Induction:   PONV Risk Score and Plan: 2 and Propofol infusion and Treatment may vary due to age or medical condition  Airway Management Planned: Nasal Cannula and Natural Airway  Additional Equipment: None  Intra-op Plan:   Post-operative Plan:   Informed Consent: I have reviewed the patients History and Physical, chart, labs and discussed the procedure including the risks, benefits and alternatives for the proposed anesthesia with the patient or authorized representative who has indicated his/her understanding and acceptance.       Plan Discussed with: CRNA and Anesthesiologist  Anesthesia Plan Comments:         Anesthesia Quick Evaluation

## 2021-10-21 ENCOUNTER — Encounter (HOSPITAL_BASED_OUTPATIENT_CLINIC_OR_DEPARTMENT_OTHER)
Admission: RE | Disposition: A | Discharge status: Home / Self Care | Payer: Self-pay | Source: Ambulatory Visit | Attending: Orthopedic Surgery

## 2021-10-21 ENCOUNTER — Ambulatory Visit (HOSPITAL_BASED_OUTPATIENT_CLINIC_OR_DEPARTMENT_OTHER): Payer: No Typology Code available for payment source | Admitting: Anesthesiology

## 2021-10-21 ENCOUNTER — Ambulatory Visit (HOSPITAL_BASED_OUTPATIENT_CLINIC_OR_DEPARTMENT_OTHER)
Admission: RE | Admit: 2021-10-21 | Discharge: 2021-10-21 | Disposition: A | Discharge status: Home / Self Care | Payer: No Typology Code available for payment source | Source: Ambulatory Visit | Attending: Orthopedic Surgery | Admitting: Orthopedic Surgery

## 2021-10-21 ENCOUNTER — Other Ambulatory Visit: Payer: Self-pay

## 2021-10-21 ENCOUNTER — Encounter (HOSPITAL_BASED_OUTPATIENT_CLINIC_OR_DEPARTMENT_OTHER): Payer: Self-pay | Admitting: Orthopedic Surgery

## 2021-10-21 DIAGNOSIS — S638X1A Sprain of other part of right wrist and hand, initial encounter: Secondary | ICD-10-CM | POA: Diagnosis present

## 2021-10-21 DIAGNOSIS — Z79899 Other long term (current) drug therapy: Secondary | ICD-10-CM | POA: Insufficient documentation

## 2021-10-21 DIAGNOSIS — X58XXXA Exposure to other specified factors, initial encounter: Secondary | ICD-10-CM | POA: Diagnosis not present

## 2021-10-21 DIAGNOSIS — K449 Diaphragmatic hernia without obstruction or gangrene: Secondary | ICD-10-CM | POA: Insufficient documentation

## 2021-10-21 DIAGNOSIS — I480 Paroxysmal atrial fibrillation: Secondary | ICD-10-CM | POA: Insufficient documentation

## 2021-10-21 DIAGNOSIS — Z01818 Encounter for other preprocedural examination: Secondary | ICD-10-CM

## 2021-10-21 DIAGNOSIS — G4730 Sleep apnea, unspecified: Secondary | ICD-10-CM | POA: Diagnosis not present

## 2021-10-21 DIAGNOSIS — Z85038 Personal history of other malignant neoplasm of large intestine: Secondary | ICD-10-CM | POA: Diagnosis not present

## 2021-10-21 DIAGNOSIS — M1990 Unspecified osteoarthritis, unspecified site: Secondary | ICD-10-CM

## 2021-10-21 DIAGNOSIS — I10 Essential (primary) hypertension: Secondary | ICD-10-CM

## 2021-10-21 DIAGNOSIS — M65831 Other synovitis and tenosynovitis, right forearm: Secondary | ICD-10-CM | POA: Diagnosis not present

## 2021-10-21 DIAGNOSIS — S63591A Other specified sprain of right wrist, initial encounter: Secondary | ICD-10-CM | POA: Diagnosis not present

## 2021-10-21 DIAGNOSIS — M94231 Chondromalacia, right wrist: Secondary | ICD-10-CM | POA: Insufficient documentation

## 2021-10-21 DIAGNOSIS — M25531 Pain in right wrist: Secondary | ICD-10-CM

## 2021-10-21 HISTORY — PX: WRIST ARTHROSCOPY: SHX838

## 2021-10-21 SURGERY — ARTHROSCOPY, WRIST
Anesthesia: Regional | Site: Wrist | Laterality: Right

## 2021-10-21 MED ORDER — OXYCODONE HCL 5 MG/5ML PO SOLN: 5.0000 mg | Freq: Once | ORAL | Status: DC | PRN

## 2021-10-21 MED ORDER — PROPOFOL 10 MG/ML IV BOLUS
INTRAVENOUS | Status: DC | PRN
  Administered 2021-10-21 (×2): 20 mg via INTRAVENOUS

## 2021-10-21 MED ORDER — MIDAZOLAM HCL 2 MG/2ML IJ SOLN
INTRAMUSCULAR | Status: AC
  Filled 2021-10-21: qty 2

## 2021-10-21 MED ORDER — PROPOFOL 500 MG/50ML IV EMUL
INTRAVENOUS | Status: DC | PRN
  Administered 2021-10-21: 100 ug/kg/min via INTRAVENOUS

## 2021-10-21 MED ORDER — OXYCODONE HCL 5 MG PO TABS: 5.0000 mg | ORAL_TABLET | Freq: Once | ORAL | Status: DC | PRN

## 2021-10-21 MED ORDER — SODIUM CHLORIDE (PF) 0.9 % IJ SOLN
INTRAMUSCULAR | Status: DC | PRN
  Administered 2021-10-21: 30 mL

## 2021-10-21 MED ORDER — PROPOFOL 10 MG/ML IV BOLUS
INTRAVENOUS | Status: AC
  Filled 2021-10-21: qty 20

## 2021-10-21 MED ORDER — DEXAMETHASONE SODIUM PHOSPHATE 10 MG/ML IJ SOLN
INTRAMUSCULAR | Status: AC
  Filled 2021-10-21: qty 1

## 2021-10-21 MED ORDER — DEXAMETHASONE SODIUM PHOSPHATE 4 MG/ML IJ SOLN
INTRAMUSCULAR | Status: DC | PRN
  Administered 2021-10-21: 4 mg via INTRAVENOUS

## 2021-10-21 MED ORDER — FENTANYL CITRATE (PF) 100 MCG/2ML IJ SOLN
100.0000 ug | Freq: Once | INTRAMUSCULAR | Status: AC
  Administered 2021-10-21: 50 ug via INTRAVENOUS

## 2021-10-21 MED ORDER — LACTATED RINGERS IV SOLN: INTRAVENOUS | Status: DC

## 2021-10-21 MED ORDER — 0.9 % SODIUM CHLORIDE (POUR BTL) OPTIME
TOPICAL | Status: DC | PRN
  Administered 2021-10-21: 500 mL

## 2021-10-21 MED ORDER — FENTANYL CITRATE (PF) 100 MCG/2ML IJ SOLN
INTRAMUSCULAR | Status: AC
  Filled 2021-10-21: qty 2

## 2021-10-21 MED ORDER — DEXMEDETOMIDINE (PRECEDEX) IN NS 20 MCG/5ML (4 MCG/ML) IV SYRINGE
PREFILLED_SYRINGE | INTRAVENOUS | Status: DC | PRN
  Administered 2021-10-21 (×3): 4 ug via INTRAVENOUS

## 2021-10-21 MED ORDER — ACETAMINOPHEN 500 MG PO TABS
ORAL_TABLET | ORAL | Status: AC
  Filled 2021-10-21: qty 2

## 2021-10-21 MED ORDER — ACETAMINOPHEN 500 MG PO TABS
1000.0000 mg | ORAL_TABLET | Freq: Once | ORAL | Status: AC
Start: 2021-10-21 — End: 2021-10-21
  Administered 2021-10-21: 1000 mg via ORAL

## 2021-10-21 MED ORDER — CEFAZOLIN SODIUM-DEXTROSE 2-4 GM/100ML-% IV SOLN
2.0000 g | INTRAVENOUS | Status: AC
  Administered 2021-10-21: 2 g via INTRAVENOUS

## 2021-10-21 MED ORDER — FENTANYL CITRATE (PF) 100 MCG/2ML IJ SOLN: 25.0000 ug | INTRAMUSCULAR | Status: DC | PRN

## 2021-10-21 MED ORDER — LIDOCAINE HCL (CARDIAC) PF 100 MG/5ML IV SOSY
PREFILLED_SYRINGE | INTRAVENOUS | Status: DC | PRN
  Administered 2021-10-21: 40 mg via INTRAVENOUS

## 2021-10-21 MED ORDER — MIDAZOLAM HCL 2 MG/2ML IJ SOLN
2.0000 mg | Freq: Once | INTRAMUSCULAR | Status: AC
  Administered 2021-10-21: 2 mg via INTRAVENOUS

## 2021-10-21 MED ORDER — PROMETHAZINE HCL 25 MG/ML IJ SOLN: 6.2500 mg | INTRAMUSCULAR | Status: DC | PRN

## 2021-10-21 MED ORDER — SODIUM CHLORIDE 0.9 % IR SOLN
Status: DC | PRN
  Administered 2021-10-21: 1000 mL

## 2021-10-21 MED ORDER — LIDOCAINE HCL (PF) 2 % IJ SOLN
INTRAMUSCULAR | Status: AC
  Filled 2021-10-21: qty 5

## 2021-10-21 MED ORDER — BUPIVACAINE-EPINEPHRINE (PF) 0.5% -1:200000 IJ SOLN
INTRAMUSCULAR | Status: DC | PRN
  Administered 2021-10-21: 30 mL via PERINEURAL

## 2021-10-21 MED ORDER — CEFAZOLIN SODIUM-DEXTROSE 2-4 GM/100ML-% IV SOLN
INTRAVENOUS | Status: AC
  Filled 2021-10-21: qty 100

## 2021-10-21 MED ORDER — ONDANSETRON HCL 4 MG/2ML IJ SOLN
INTRAMUSCULAR | Status: DC | PRN
  Administered 2021-10-21: 4 mg via INTRAVENOUS

## 2021-10-21 MED ORDER — BACITRACIN ZINC 500 UNIT/GM EX OINT
TOPICAL_OINTMENT | CUTANEOUS | Status: DC | PRN
  Administered 2021-10-21: 1 via TOPICAL

## 2021-10-21 MED ORDER — ONDANSETRON HCL 4 MG/2ML IJ SOLN
INTRAMUSCULAR | Status: AC
  Filled 2021-10-21: qty 2

## 2021-10-21 MED ORDER — PHENYLEPHRINE 80 MCG/ML (10ML) SYRINGE FOR IV PUSH (FOR BLOOD PRESSURE SUPPORT)
PREFILLED_SYRINGE | INTRAVENOUS | Status: DC | PRN
  Administered 2021-10-21 (×2): 40 ug via INTRAVENOUS

## 2021-10-21 SURGICAL SUPPLY — 46 items
APL PRP STRL LF DISP 70% ISPRP (MISCELLANEOUS) ×1
BLADE SURG 11 STRL SS (BLADE) ×4 IMPLANT
BNDG CMPR 9X4 STRL LF SNTH (GAUZE/BANDAGES/DRESSINGS) ×1
BNDG ELASTIC 4X5.8 VLCR STR LF (GAUZE/BANDAGES/DRESSINGS) ×3 IMPLANT
BNDG ESMARK 4X9 LF (GAUZE/BANDAGES/DRESSINGS) ×1 IMPLANT
BNDG GZE 12X3 1 PLY HI ABS (GAUZE/BANDAGES/DRESSINGS) ×1
BNDG PLASTER X FAST 3X3 WHT LF (CAST SUPPLIES) ×3 IMPLANT
BNDG PLSTR 9X3 FST ST WHT (CAST SUPPLIES) ×1
BNDG STRETCH GAUZE 3IN X12FT (GAUZE/BANDAGES/DRESSINGS) ×1 IMPLANT
CHLORAPREP W/TINT 26 (MISCELLANEOUS) ×3 IMPLANT
COVER BACK TABLE 60X90IN (DRAPES) ×3 IMPLANT
CUFF TOURN SGL QUICK 18X4 (TOURNIQUET CUFF) ×3 IMPLANT
DECANTER SPIKE VIAL GLASS SM (MISCELLANEOUS) ×3 IMPLANT
DRAPE EXTREMITY T 121X128X90 (DISPOSABLE) ×3 IMPLANT
DRAPE SHEET LG 3/4 BI-LAMINATE (DRAPES) ×3 IMPLANT
DRSG ADAPTIC 3X8 NADH LF (GAUZE/BANDAGES/DRESSINGS) ×1 IMPLANT
DRSG EMULSION OIL 3X3 NADH (GAUZE/BANDAGES/DRESSINGS) ×3 IMPLANT
GAUZE 4X4 16PLY ~~LOC~~+RFID DBL (SPONGE) ×3 IMPLANT
GAUZE SPONGE 4X4 12PLY STRL (GAUZE/BANDAGES/DRESSINGS) ×3 IMPLANT
GLOVE BIOGEL PI IND STRL 7.5 (GLOVE) ×2 IMPLANT
GLOVE BIOGEL PI INDICATOR 7.5 (GLOVE) ×1
GOWN STRL REUS W/TWL LRG LVL3 (GOWN DISPOSABLE) ×5 IMPLANT
KIT TURNOVER CYSTO (KITS) ×3 IMPLANT
NEEDLE HYPO 22GX1.5 SAFETY (NEEDLE) ×3 IMPLANT
NS IRRIG 500ML POUR BTL (IV SOLUTION) ×3 IMPLANT
PACK BASIN DAY SURGERY FS (CUSTOM PROCEDURE TRAY) ×3 IMPLANT
PAD CAST 4YDX4 CTTN HI CHSV (CAST SUPPLIES) ×2 IMPLANT
PADDING CAST COTTON 4X4 STRL (CAST SUPPLIES) ×2
PROBE BIPOLAR ARTHRO 85MM 30D (MISCELLANEOUS) IMPLANT
SET SM JOINT TUBING/CANN (CANNULA) ×3 IMPLANT
SHAVER DISSECTOR 3.0 (BURR) IMPLANT
SHAVER SABRE 2.0 (BURR) ×1 IMPLANT
SLING ARM FOAM STRAP LRG (SOFTGOODS) ×1 IMPLANT
SPLINT FIBERGLASS 3X35 (CAST SUPPLIES) IMPLANT
SUT ETHILON 4 0 PS 2 18 (SUTURE) ×3 IMPLANT
SUT FIBERWIRE 2-0 18 17.9 3/8 (SUTURE)
SUTURE FIBERWR 2-0 18 17.9 3/8 (SUTURE) IMPLANT
SYR 10ML LL (SYRINGE) ×3 IMPLANT
SYR BULB EAR ULCER 3OZ GRN STR (SYRINGE) ×3 IMPLANT
TAPE SURG TRANSPORE 1 IN (GAUZE/BANDAGES/DRESSINGS) ×2 IMPLANT
TAPE SURGICAL TRANSPORE 1 IN (GAUZE/BANDAGES/DRESSINGS) ×2
TOWEL OR 17X26 10 PK STRL BLUE (TOWEL DISPOSABLE) ×3 IMPLANT
TRAP DIGIT (INSTRUMENTS) ×1 IMPLANT
TUBE CONNECTING 12X1/4 (SUCTIONS) ×3 IMPLANT
UNDERPAD 30X36 HEAVY ABSORB (UNDERPADS AND DIAPERS) ×3 IMPLANT
WAND 1.5 MICROBLATOR (SURGICAL WAND) IMPLANT

## 2021-10-21 NOTE — Op Note (Signed)
  OPERATIVE NOTE  DATE OF PROCEDURE: 10/21/2021  SURGEONS:  Primary: Orene Desanctis, MD  PREOPERATIVE DIAGNOSIS: Right wrist SL and TFCC tear  POSTOPERATIVE DIAGNOSIS: Same  NAME OF PROCEDURE:   Right wrist diagnostic arthroscopy with SL and TFCC debridement  ANESTHESIA: Regional Block  SKIN PREPARATION: Hibiclens  ESTIMATED BLOOD LOSS: Minimal  IMPLANTS: None  INDICATIONS:  Christina Fuller is a 56 y.o. female who has the above preoperative diagnosis. The patient has decided to proceed with surgical intervention.  Risks, benefits and alternatives of operative management were discussed including, but not limited to, risks of anesthesia complications, infection, pain, persistent symptoms, stiffness, need for future surgery.  The patient understands, agrees and elects to proceed with surgery.    DESCRIPTION OF PROCEDURE: The patient was met in the pre-operative area and their identity was verified.  The operative location and laterality was also verified and marked.  The patient was brought to the OR and was placed supine on the table.  After repeat patient identification with the operative team anesthesia was provided and the patient was prepped and draped in the usual sterile fashion.  A final timeout was performed verifying the correction patient, procedure, location and laterality.  Preoperative antibiotics were provided and then the right upper extremity was elevated and exsanguinated with an Esmarch and the tourniquet was inflated to 250 mmHg.  A diagnostic wrist arthroscopy was performed by setting up the wrist in the traction tower with approximately 10 to 12 pounds of traction via finger traps in the vertical position.  A dorsal 3,4 portal was established and the arthroscopy camera was placed within the radiocarpal joint.  A diagnostic arthroscopy was performed with the following arthroscopic findings:   Intact volar ligaments as well as scaphoid facet of the radius.  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  There was a full-thickness tear of the scapholunate ligament with fraying and synovitis.  There was synovitis within the prestyloid recess and a small peripheral tear and radial tear about the TFCC.  The foveal attachment was intact with a negative hook test and trampoline sign.  On midcarpal arthroscopy there was a Secretary/administrator grade 3 scapholunate ligament tear remainder of the cartilaginous surfaces remained intact.    A 3 4, 6R, midcarpal ulnar and midcarpal radial portal were established. A mechanical shaver was utilized for SL and TFCC debridement back to a stable border. There was no DRUJ instability. The wound was thoroughly irrigated and incisions were closed in standard fashion with 4-0 nylon sutures in simple fashion. A sterile soft bandage was placed followed by a short arm splint. The tourniquet was deflated and the fingers were pink and warm and well perfused. The patient was brought to PACU for recovery in stable condition.    Matt Holmes, MD

## 2021-10-21 NOTE — Discharge Instructions (Addendum)
Orthopaedic Hand Surgery Discharge Instructions  WEIGHT BEARING STATUS: Non weight bearing on operative extremity  DRESSING CARE: Please keep your dressing/splint/cast clean and dry until your follow-up appointment. You may shower by placing a waterproof covering over your dressing/splint/cast. Contact your surgeon if your splint/cast gets wet. It will need to be changed to prevent skin breakdown.  PAIN CONTROL: First line medications for post operative pain control are Tylenol (acetaminophen) and Motrin (ibuprofen) if you are able to take these medications. If you have been prescribed a medication these can be taken as breakthrough pain medications. Please note that some narcotic pain medication has acetaminophen added and you should never consume more than 4,'000mg'$  of acetaminophen in 24-hour period. Please note that if you are given Toradol (ketorolac) you should not take similar medications such as ibuprofen or naproxen.  DISCHARGE MEDICATIONS: If you have been prescribed medication it was sent electronically to your pharmacy. No changes have been made to your home medications.  ICE/ELEVATION: Ice and elevate your injured extremity as needed. Avoid direct contact of ice with skin.   BANDAGE FEELS TOO TIGHT: If your bandage feels too tight, first make sure you are elevating your fingers as much as possible. The outer layer of the bandage can be unwrapped and reapplied more loosely. If no improvement, you may carefully cut the inner layer longitudinally until the pressure has resolved and then rewrap the outer layer. If you are not comfortable with these instructions, please call the office and the bandage can be changed for you.   FOLLOW UP: You will be called after surgery with an appointment date and time, however if you have not received a phone call within 3 days, please call during regular office hours at [phone] to schedule a post operative appointment.  Please Seek Medical Attention  if: Call MD for: pain or pressure in chest, jaw, arm, back, neck  Call MD for: temperature greater than 101 F for more than 24 hrs Call MD for: difficulty breathing Call MD for: incision redness, bleeding, drainage  Call MD for: palpitations or feeling that the heart is racing  Call MD for: increased swelling in arm, leg, ankle, or abdomen  Call MD for: lightheadedness, dizziness, fainting Call 911 or go to ER for any medical emergency if you are not able to get in touch with your doctor   J. Sable Feil, MD Orthopaedic Hand Surgeon [facility] Office number: [phone] [address], [address] [place], [place] [address]    Post Anesthesia Home Care Instructions  Activity: Get plenty of rest for the remainder of the day. A responsible individual must stay with you for 24 hours following the procedure.  For the next 24 hours, DO NOT: -Drive a car -Paediatric nurse -Drink alcoholic beverages -Take any medication unless instructed by your physician -Make any legal decisions or sign important papers.  Meals: Start with liquid foods such as gelatin or soup. Progress to regular foods as tolerated. Avoid greasy, spicy, heavy foods. If nausea and/or vomiting occur, drink only clear liquids until the nausea and/or vomiting subsides. Call your physician if vomiting continues.  Special Instructions/Symptoms: Your throat may feel dry or sore from the anesthesia or the breathing tube placed in your throat during surgery. If this causes discomfort, gargle with warm salt water. The discomfort should disappear within 24 hours.     No acetaminophen/Tylenol until after 2:00 pm today if needed.   Regional Anesthesia Blocks  1. Numbness or the inability to move the "blocked" extremity may  last from 3-48 hours after placement. The length of time depends on the medication injected and your individual response to the medication. If the numbness is not going away after 48 hours, call your  surgeon.  2. The extremity that is blocked will need to be protected until the numbness is gone and the  Strength has returned. Because you cannot feel it, you will need to take extra care to avoid injury. Because it may be weak, you may have difficulty moving it or using it. You may not know what position it is in without looking at it while the block is in effect.  3. For blocks in the legs and feet, returning to weight bearing and walking needs to be done carefully. You will need to wait until the numbness is entirely gone and the strength has returned. You should be able to move your leg and foot normally before you try and bear weight or walk. You will need someone to be with you when you first try to ensure you do not fall and possibly risk injury.  4. Bruising and tenderness at the needle site are common side effects and will resolve in a few days.  5. Persistent numbness or new problems with movement should be communicated to the surgeon or the [facility] [phone] [place] [phone]).

## 2021-10-21 NOTE — Transfer of Care (Signed)
Immediate Anesthesia Transfer of Care Note  Patient: Christina Fuller  Procedure(s) Performed: Procedure(s) (LRB): Right wrist arthroscopy scapholunate and triangular fibrocartilage complex debridement (Right)  Patient Location: PACU  Anesthesia Type: Regional/MAC  Level of Consciousness: awake, alert  and oriented  Airway & Oxygen Therapy: Patient Spontanous Breathing  Post-op Assessment: Report given to PACU RN and Post -op Vital signs reviewed and stable  Post vital signs: Reviewed and stable  Complications: No apparent anesthesia complications Last Vitals:  Vitals Value Taken Time  BP 133/62 10/21/21 1006  Temp    Pulse 66 10/21/21 1008  Resp 12 10/21/21 1008  SpO2 97 % 10/21/21 1008  Vitals shown include unvalidated device data.  Last Pain:  Vitals:   10/21/21 0757  TempSrc: Oral         Complications: No notable events documented.

## 2021-10-21 NOTE — Anesthesia Procedure Notes (Signed)
Anesthesia Regional Block: Supraclavicular block   Pre-Anesthetic Checklist: , timeout performed,  Correct Patient, Correct Site, Correct Laterality,  Correct Procedure, Correct Position, site marked,  Risks and benefits discussed,  Surgical consent,  Pre-op evaluation,  At surgeon's request and post-op pain management  Laterality: Right  Prep: chloraprep       Needles:  Injection technique: Single-shot  Needle Type: Echogenic Needle     Needle Length: 5cm  Needle Gauge: 21     Additional Needles:   Narrative:  Start time: 10/21/2021 8:17 AM End time: 10/21/2021 8:21 AM Injection made incrementally with aspirations every 5 mL.  Performed by: Personally  Anesthesiologist: Audry Pili, MD  Additional Notes: No pain on injection. No increased resistance to injection. Injection made in 5cc increments. Good needle visualization. Patient tolerated the procedure well.

## 2021-10-21 NOTE — Interval H&P Note (Signed)
History and Physical Interval Note:  10/21/2021 8:13 AM  Christina Fuller  has presented today for surgery, with the diagnosis of Right wrist SL and TFCC tear.  The various methods of treatment have been discussed with the patient and family. After consideration of risks, benefits and other options for treatment, the patient has consented to  Procedure(s): Right wrist arthroscopy scapholunate and triangular fibrocartilage complex debridement, possible repair (Right) as a surgical intervention.  The patient's history has been reviewed, patient examined, no change in status, stable for surgery.  I have reviewed the patient's chart and labs.  Questions were answered to the patient's satisfaction.     Orene Desanctis

## 2021-10-21 NOTE — Progress Notes (Signed)
Assisted Dr. Brock with right, interscalene , ultrasound guided block. Side rails up, monitors on throughout procedure. See vital signs in flow sheet. Tolerated Procedure well. 

## 2021-10-21 NOTE — Anesthesia Procedure Notes (Signed)
Procedure Name: MAC Date/Time: 10/21/2021 8:42 AM  Performed by: Mechele Claude, CRNAPre-anesthesia Checklist: Patient identified, Emergency Drugs available, Suction available and Patient being monitored Oxygen Delivery Method: Simple face mask Placement Confirmation: positive ETCO2 and breath sounds checked- equal and bilateral

## 2021-10-21 NOTE — Anesthesia Postprocedure Evaluation (Signed)
Anesthesia Post Note  Patient: Christina Fuller  Procedure(s) Performed: Right wrist arthroscopy scapholunate and triangular fibrocartilage complex debridement (Right: Wrist)     Patient location during evaluation: PACU Anesthesia Type: Regional Level of consciousness: awake and alert Pain management: pain level controlled Vital Signs Assessment: post-procedure vital signs reviewed and stable Respiratory status: spontaneous breathing, nonlabored ventilation and respiratory function stable Cardiovascular status: stable, blood pressure returned to baseline and bradycardic Anesthetic complications: no   No notable events documented.  Last Vitals:  Vitals:   10/21/21 1030 10/21/21 1045  BP: (!) 146/69 131/73  Pulse: (!) 47 (!) 48  Resp: 10 11  Temp:    SpO2: 98% 99%    Last Pain:  Vitals:   10/21/21 1030  TempSrc:   PainSc: 0-No pain                 Audry Pili

## 2021-10-22 ENCOUNTER — Encounter (HOSPITAL_BASED_OUTPATIENT_CLINIC_OR_DEPARTMENT_OTHER): Payer: Self-pay | Admitting: Orthopedic Surgery

## 2022-09-02 ENCOUNTER — Other Ambulatory Visit (HOSPITAL_COMMUNITY): Payer: Self-pay | Admitting: Orthopedic Surgery

## 2022-09-02 ENCOUNTER — Other Ambulatory Visit: Payer: Self-pay | Admitting: Orthopedic Surgery

## 2022-09-02 DIAGNOSIS — M1711 Unilateral primary osteoarthritis, right knee: Secondary | ICD-10-CM

## 2022-12-11 NOTE — Progress Notes (Signed)
Sent message, via epic in basket, requesting orders in epic from surgeon.  

## 2022-12-16 ENCOUNTER — Ambulatory Visit: Payer: Self-pay | Admitting: Student

## 2022-12-16 NOTE — Progress Notes (Signed)
Please send preop orders for PST visit 12/18/22.

## 2022-12-16 NOTE — Patient Instructions (Addendum)
SURGICAL WAITING ROOM VISITATION  Patients having surgery or a procedure may have no more than 2 support people in the waiting area - these visitors may rotate.    Children under the age of 42 must have an adult with them who is not the patient.  Due to an increase in RSV and influenza rates and associated hospitalizations, children ages 42 and under may not visit patients in [facility] hospitals.  If the patient needs to stay at the hospital during part of their recovery, the visitor guidelines for inpatient rooms apply. Pre-op nurse will coordinate an appropriate time for 1 support person to accompany patient in pre-op.  This support person may not rotate.    Please refer to the [facility] website for the visitor guidelines for Inpatients (after your surgery is over and you are in a regular room).       Your procedure is scheduled on: 12/31/22   Report to [facility] Main Entrance    Report to admitting at 8:10 AM   Call this number if you have problems the morning of surgery [phone]   Do not eat food :After Midnight.   After Midnight you may have the following liquids until 7:40 am DAY OF SURGERY  Water Non-Citrus Juices (without pulp, NO RED-Apple, White grape, White cranberry) Black Coffee (NO MILK/CREAM OR CREAMERS, sugar ok)  Clear Tea (NO MILK/CREAM OR CREAMERS, sugar ok) regular and decaf                             Plain Jell-O (NO RED)                                           Fruit ices (not with fruit pulp, NO RED)                                     Popsicles (NO RED)                                                               Sports drinks like Gatorade (NO RED)                The day of surgery:  Drink ONE (1) Pre-Surgery Clear Ensure at 7:40 AM the morning of surgery. Drink in one sitting. Do not sip.  This drink was given to you during your hospital  pre-op appointment visit. Nothing else to drink after completing the  Pre-Surgery Clear  Ensure    Oral Hygiene is also important to reduce your risk of infection.                                    Remember - BRUSH YOUR TEETH THE MORNING OF SURGERY WITH YOUR REGULAR TOOTHPASTE   Stop all vitamins and herbal supplements 7 days before surgery.   Take these medicines the morning of surgery with A SIP OF WATER:   Bring CPAP mask and tubing day of surgery.  You may not have any metal on your body including hair pins, jewelry, and body piercing             Do not wear make-up, lotions, powders, perfumes/cologne, or deodorant  Do not wear nail polish including gel and S&S, artificial/acrylic nails, or any other type of covering on natural nails including finger and toenails. If you have artificial nails, gel coating, etc. that needs to be removed by a nail salon please have this removed prior to surgery or surgery may need to be canceled/ delayed if the surgeon/ anesthesia feels like they are unable to be safely monitored.   Do not shave  48 hours prior to surgery.    Do not bring valuables to the hospital. [facility] IS NOT             RESPONSIBLE   FOR VALUABLES.   Contacts, glasses, dentures or bridgework may not be worn into surgery.   Bring small overnight bag day of surgery.   DO NOT BRING YOUR HOME MEDICATIONS TO THE HOSPITAL. PHARMACY WILL DISPENSE MEDICATIONS LISTED ON YOUR MEDICATION LIST TO YOU DURING YOUR ADMISSION IN THE HOSPITAL!    Patients discharged on the day of surgery will not be allowed to drive home.  Someone NEEDS to stay with you for the first 24 hours after anesthesia.   Special Instructions: Bring a copy of your healthcare power of attorney and living will documents the day of surgery if you haven't scanned them before.              Please read over the following fact sheets you were given: IF YOU HAVE QUESTIONS ABOUT YOUR PRE-OP INSTRUCTIONS PLEASE CALL [phone] [facility]   If you received a COVID test during your  pre-op visit  it is requested that you wear a mask when out in public, stay away from anyone that may not be feeling well and notify your surgeon if you develop symptoms. If you test positive for Covid or have been in contact with anyone that has tested positive in the last 10 days please notify you surgeon.      Pre-operative 5 CHG [facility] Instructions   You can play a key role in reducing the risk of infection after surgery. Your skin needs to be as free of germs as possible. You can reduce the number of germs on your skin by washing with CHG (chlorhexidine gluconate) soap before surgery. CHG is an antiseptic soap that kills germs and continues to kill germs even after washing.   DO NOT use if you have an allergy to chlorhexidine/CHG or antibacterial soaps. If your skin becomes reddened or irritated, stop using the CHG and notify one of our RNs at [phone].   Please shower with the CHG soap starting 4 days before surgery using the following schedule:     Please keep in mind the following:  DO NOT shave, including legs and underarms, starting the day of your first shower.   You may shave your face at any point before/day of surgery.  Place clean sheets on your bed the day you start using CHG soap. Use a clean washcloth (not used since being washed) for each shower. DO NOT sleep with pets once you start using the CHG.   CHG Shower Instructions:  If you choose to wash your hair and private area, wash first with your normal shampoo/soap.  After you use shampoo/soap, rinse your hair and body thoroughly to remove shampoo/soap residue.  Turn the  water OFF and apply about 3 tablespoons (45 ml) of CHG soap to a CLEAN washcloth.  Apply CHG soap ONLY FROM YOUR NECK DOWN TO YOUR TOES (washing for 3-5 minutes)  DO NOT use CHG soap on face, private areas, open wounds, or sores.  Pay special attention to the area where your surgery is being performed.  If you are having back surgery, having someone  wash your back for you may be helpful. Wait 2 minutes after CHG soap is applied, then you may rinse off the CHG soap.  Pat dry with a clean towel  Put on clean clothes/pajamas   If you choose to wear lotion, please use ONLY the CHG-compatible lotions on the back of this paper.     Additional instructions for the day of surgery: DO NOT APPLY any lotions, deodorants, cologne, or perfumes.   Put on clean/comfortable clothes.  Brush your teeth.  Ask your nurse before applying any prescription medications to the skin.      CHG Compatible Lotions   Aveeno Moisturizing lotion  Cetaphil Moisturizing Cream  Cetaphil Moisturizing Lotion  Clairol Herbal Essence Moisturizing Lotion, Dry Skin  Clairol Herbal Essence Moisturizing Lotion, Extra Dry Skin  Clairol Herbal Essence Moisturizing Lotion, Normal Skin  Curel Age Defying Therapeutic Moisturizing Lotion with Alpha Hydroxy  Curel Extreme Care Body Lotion  Curel Soothing Hands Moisturizing Hand Lotion  Curel Therapeutic Moisturizing Cream, Fragrance-Free  Curel Therapeutic Moisturizing Lotion, Fragrance-Free  Curel Therapeutic Moisturizing Lotion, Original Formula  Eucerin Daily Replenishing Lotion  Eucerin Dry Skin Therapy Plus Alpha Hydroxy Crme  Eucerin Dry Skin Therapy Plus Alpha Hydroxy Lotion  Eucerin Original Crme  Eucerin Original Lotion  Eucerin Plus Crme Eucerin Plus Lotion  Eucerin TriLipid Replenishing Lotion  Keri Anti-Bacterial Hand Lotion  Keri Deep Conditioning Original Lotion Dry Skin Formula Softly Scented  Keri Deep Conditioning Original Lotion, Fragrance Free Sensitive Skin Formula  Keri Lotion Fast Absorbing Fragrance Free Sensitive Skin Formula  Keri Lotion Fast Absorbing Softly Scented Dry Skin Formula  Keri Original Lotion  Keri Skin Renewal Lotion Keri Silky Smooth Lotion  Keri Silky Smooth Sensitive Skin Lotion  Nivea Body Creamy Conditioning Oil  Nivea Body Extra Enriched TEFL teacher Moisturizing Lotion Nivea Crme  Nivea Skin Firming Lotion  NutraDerm 30 Skin Lotion  NutraDerm Skin Lotion  NutraDerm Therapeutic Skin Cream  NutraDerm Therapeutic Skin Lotion  ProShield Protective Hand Cream  Provon moisturizing lotion  WHAT IS A BLOOD TRANSFUSION? Blood Transfusion Information  A transfusion is the replacement of blood or some of its parts. Blood is made up of multiple cells which provide different functions. Red blood cells carry oxygen and are used for blood loss replacement. White blood cells fight against infection. Platelets control bleeding. Plasma helps clot blood. Other blood products are available for specialized needs, such as hemophilia or other clotting disorders. BEFORE THE TRANSFUSION  Who gives blood for transfusions?  Healthy volunteers who are fully evaluated to make sure their blood is safe. This is blood bank blood. Transfusion therapy is the safest it has ever been in the practice of medicine. Before blood is taken from a donor, a complete history is taken to make sure that person has no history of diseases nor engages in risky social behavior (examples are intravenous drug use or sexual activity with multiple partners). The donor's travel history is screened to minimize risk of transmitting infections, such as malaria. The donated blood is tested for signs  of infectious diseases, such as HIV and hepatitis. The blood is then tested to be sure it is compatible with you in order to minimize the chance of a transfusion reaction. If you or a relative donates blood, this is often done in anticipation of surgery and is not appropriate for emergency situations. It takes many days to process the donated blood. RISKS AND COMPLICATIONS Although transfusion therapy is very safe and saves many lives, the main dangers of transfusion include:  Getting an infectious disease. Developing a transfusion reaction. This is an allergic reaction to  something in the blood you were given. Every precaution is taken to prevent this. The decision to have a blood transfusion has been considered carefully by your caregiver before blood is given. Blood is not given unless the benefits outweigh the risks. AFTER THE TRANSFUSION Right after receiving a blood transfusion, you will usually feel much better and more energetic. This is especially true if your red blood cells have gotten low (anemic). The transfusion raises the level of the red blood cells which carry oxygen, and this usually causes an energy increase. The nurse administering the transfusion will monitor you carefully for complications. HOME CARE INSTRUCTIONS  No special instructions are needed after a transfusion. You may find your energy is better. Speak with your caregiver about any limitations on activity for underlying diseases you may have. SEEK MEDICAL CARE IF:  Your condition is not improving after your transfusion. You develop redness or irritation at the intravenous (IV) site. SEEK IMMEDIATE MEDICAL CARE IF:  Any of the following symptoms occur over the next 12 hours: Shaking chills. You have a temperature by mouth above 102 F (38.9 C), not controlled by medicine. Chest, back, or muscle pain. People around you feel you are not acting correctly or are confused. Shortness of breath or difficulty breathing. Dizziness and fainting. You get a rash or develop hives. You have a decrease in urine output. Your urine turns a dark color or changes to pink, red, or brown. Any of the following symptoms occur over the next 10 days: You have a temperature by mouth above 102 F (38.9 C), not controlled by medicine. Shortness of breath. Weakness after normal activity. The white part of the eye turns yellow (jaundice). You have a decrease in the amount of urine or are urinating less often. Your urine turns a dark color or changes to pink, red, or brown. Document Released: 03/13/2000  Document Revised: 06/08/2011 Document Reviewed: 10/31/2007 [facility] Patient Information 2014 [place], [place].

## 2022-12-16 NOTE — Progress Notes (Signed)
Second request for pre op orders in [facility]: Left voicemail with Lynnea Ferrier.

## 2022-12-16 NOTE — Progress Notes (Addendum)
COVID Vaccine received:  []  No [x]  Yes Date of any COVID positive Test in last 90 days: no PCP - Dr. Eulis Manly Cardiologist - Dr. Lorelei Pont  Chest x-ray -  EKG -  Feb. 2024 Stress Test -  ECHO -  Cardiac Cath - nono  Bowel Prep - [x]  No  []   Yes ______  Pacemaker / ICD device [x]  No []  Yes   Spinal Cord Stimulator:[x]  No []  Yes       History of Sleep Apnea? [x]  No []  Yes   CPAP used?- []  No []  Yes    Does the patient monitor blood sugar?          [x]  No []  Yes  []  N/A  Patient has: [x]  NO Hx DM   []  Pre-DM                 []  DM1  []   DM2 Does patient have a [employer] or [employer]om? []  No []  Yes   Fasting Blood Sugar Ranges-  Checks Blood Sugar _____ times a day  GLP1 agonist / usual dose - Zepbound Last dose 12/14/22. Will hold till after surgery GLP1 instructions:  SGLT-2 inhibitors / usual dose -  SGLT-2 instructions:   Blood Thinner / Instructions:Eliquis- Hold x2 days per Md office Aspirin Instructions:no  Comments:   Activity level: Patient is able to climb a flight of stairs without difficulty; [x]  No CP  [x]  No SOB   Patient can  perform ADLs without assistance.   Anesthesia review: A-fib, HTN  Patient denies shortness of breath, fever, cough and chest pain at PAT appointment.  Patient verbalized understanding and agreement to the Pre-Surgical Instructions that were given to them at this PAT appointment. Patient was also educated of the need to review these PAT instructions again prior to his/her surgery.I reviewed the appropriate phone numbers to call if they have any and questions or concerns.

## 2022-12-18 ENCOUNTER — Encounter (HOSPITAL_COMMUNITY): Payer: Self-pay

## 2022-12-18 ENCOUNTER — Encounter (HOSPITAL_COMMUNITY)
Admission: RE | Admit: 2022-12-18 | Discharge: 2022-12-18 | Disposition: A | Discharge status: Home / Self Care | Payer: No Typology Code available for payment source | Source: Ambulatory Visit | Attending: Orthopedic Surgery

## 2022-12-18 ENCOUNTER — Other Ambulatory Visit: Payer: Self-pay

## 2022-12-18 VITALS — BP 133/71 | HR 58 | Temp 97.6°F | Resp 16 | Ht 65.5 in | Wt 200.0 lb

## 2022-12-18 DIAGNOSIS — Z01818 Encounter for other preprocedural examination: Secondary | ICD-10-CM

## 2022-12-18 DIAGNOSIS — Z01812 Encounter for preprocedural laboratory examination: Secondary | ICD-10-CM | POA: Insufficient documentation

## 2022-12-18 DIAGNOSIS — I10 Essential (primary) hypertension: Secondary | ICD-10-CM | POA: Diagnosis not present

## 2022-12-18 LAB — BASIC METABOLIC PANEL
Anion gap: 7 (ref 5–15)
BUN: 11 mg/dL (ref 6–20)
CO2: 24 mmol/L (ref 22–32)
Calcium: 8.8 mg/dL — ABNORMAL LOW (ref 8.9–10.3)
Chloride: 109 mmol/L (ref 98–111)
Creatinine, Ser: 0.93 mg/dL (ref 0.44–1.00)
GFR, Estimated: >60 mL/min (ref >60)
Glucose, Bld: 58 mg/dL — ABNORMAL LOW (ref 70–99)
Potassium: 3.8 mmol/L (ref 3.5–5.1)
Sodium: 140 mmol/L (ref 135–145)

## 2022-12-18 LAB — TYPE AND SCREEN
ABO/RH(D): O POS
Antibody Screen: NEGATIVE

## 2022-12-18 LAB — SURGICAL PCR SCREEN
MRSA, PCR: NEGATIVE
Staphylococcus aureus: NEGATIVE

## 2022-12-23 NOTE — Progress Notes (Signed)
Anesthesia Chart Review   Case: [id number] Date/Time: 12/31/22 1025   Procedure: TOTAL KNEE REVISION (Right: Knee) - 250   Anesthesia type: Spinal   Pre-op diagnosis: Right  knee flexion instability   Location: [facility] ROOM 08 / [facility]   Surgeons: Samson Frederic, MD       DISCUSSION:57 y.o. never smoker with h/o PAF, HTN, right knee flexion instability scheduled for above procedure 12/31/2022 with Dr. Samson Frederic.   Pt last seen by cardiology 05/26/22. At this visit pt complained of precordial chest discomfort. Coronary CT ordered at this visit. Coronary CT 06/17/2022 with calcium score 0, normal coronary arteries.   Pt reports she was advised to hold Eliquis 2 days. Message left with Dr. Kathline Magic office.  Pt will need to hold Eliquis three days prior to spinal.   Spoke with patient, she reports last dose of Eliquis was morning of 12/27/2022.  VS: BP 133/71   Pulse (!) 58   Temp 36.4 C (Oral)   Resp 16   Ht 5' 5.5" (1.664 m)   Wt 90.7 kg   SpO2 100%   BMI 32.78 kg/m   PROVIDERS: Dan Maker, MD is PCP   Cardiologist - Dr. Lorelei Pont  LABS: Labs reviewed: Acceptable for surgery. (all labs ordered are listed, but only abnormal results are displayed)  Labs Reviewed  BASIC METABOLIC PANEL - Abnormal; Notable for the following components:      Result Value   Glucose, Bld 58 (*)    Calcium 8.8 (*)    All other components within normal limits  SURGICAL PCR SCREEN  TYPE AND SCREEN     IMAGES:   EKG:   CV: CT Angio Coronary 06/17/2022 IMPRESSION:  - Calcium score of 0.    -  Normal left ventricular function with EF of 72%  -  Normal course and trajectory of coronary arteries. Limited coronary visualization due to misregistration / motion artifact. The RCA is poorly visualized. No proximal L-sided coronary plaque or stenosis is appreciated.  -  An addendum commenting on non-cardiac structures to follow.   Stress Test 12/16/2021 Impression:   1. This is a normal  perfusion study with no evidence of ischemia.  Normal wall motion and wall thickening noted  2. The above results will be discussed with the patient and will decide next step  3. The study is on file and stored in a permanent location   Echo 06/24/2021 Left Ventricle: Left ventricle size is normal. False tendon noted in LV  apex (Normal variant).    Left Ventricle: The calculated left ventricular ejection fraction is  63%.   Left Ventricle: Systolic function is normal. EF: 60-65%.    Left Ventricle: Doppler parameters consistent with mild diastolic  dysfunction and low to normal LA pressure.    Left Ventricle: There is mild hypertrophy.    Left Ventricle: Wall motion is normal.    Pericardium: There is no pericardial effusion.  Past Medical History:  Diagnosis Date   Arthritis    Carpal tunnel syndrome    Colon cancer (HCC) 2004   Dysrhythmia    GERD (gastroesophageal reflux disease)    H/O hiatal hernia    Herniated nucleus pulposus    Hypertension    Paroxysmal atrial fibrillation (HCC)    evaluated by [facility]     Past Surgical History:  Procedure Laterality Date   ABDOMINAL HYSTERECTOMY  2007   BACK SURGERY     CARPAL TUNNEL RELEASE  2011  bilateral   CHOLECYSTECTOMY  2004   JOINT REPLACEMENT Right 2015   Total Knee Replacement   LAMINECTOMY  2011   "lower back"   LASIK     PARTIAL COLECTOMY  12/2002   "colon cancer"   PORT-A-CATH REMOVAL  2005   left chest   PORTACATH PLACEMENT  2004   left chest   POSTERIOR FUSION LUMBAR SPINE  07/08/2011   L5-S1   stomach reconstruction  2005   d/t clot post partial colectomy   TONSILLECTOMY AND ADENOIDECTOMY  1971   TOTAL KNEE ARTHROPLASTY Left 04/21/2019   Procedure: TOTAL KNEE ARTHROPLASTY;  Surgeon: Eugenia Mcalpine, MD;  Location: [facility];  Service: Orthopedics;  Laterality: Left;  adductor canal   WRIST ARTHROSCOPY Right 10/21/2021   Procedure: Right wrist arthroscopy scapholunate and triangular  fibrocartilage complex debridement;  Surgeon: Gomez Cleverly, MD;  Location: [facility];  Service: Orthopedics;  Laterality: Right;    MEDICATIONS:  apixaban (ELIQUIS) 5 MG TABS tablet   atenolol (TENORMIN) 25 MG tablet   bisacodyl (DULCOLAX) 5 MG EC tablet   ferrous sulfate 325 (65 FE) MG EC tablet   Multiple Vitamin (MULTIVITAMIN WITH MINERALS) TABS tablet   topiramate (TOPAMAX) 50 MG tablet   ZEPBOUND 7.5 MG/0.5ML Pen   No current facility-administered medications for this encounter.    Jodell Cipro Ward, PA-C [facility] [phone]

## 2022-12-28 NOTE — Anesthesia Preprocedure Evaluation (Signed)
Anesthesia Evaluation  Patient identified by MRN, date of birth, ID band Patient awake    Reviewed: Allergy & Precautions, NPO status , Patient's Chart, lab work & pertinent test results  Airway Mallampati: II  TM Distance: >3 FB Neck ROM: Full    Dental no notable dental hx. (+) Teeth Intact, Dental Advisory Given   Pulmonary neg pulmonary ROS   Pulmonary exam normal breath sounds clear to auscultation       Cardiovascular hypertension, Pt. on medications and Pt. on home beta blockers Normal cardiovascular exam+ dysrhythmias (on eliquis) Atrial Fibrillation  Rhythm:Regular Rate:Normal  Normal left ventricular function with EF of 72%    Neuro/Psych  Neuromuscular disease  negative psych ROS   GI/Hepatic Neg liver ROS, hiatal hernia,GERD  ,,  Endo/Other  On GLP 1  Renal/GU negative Renal ROS     Musculoskeletal  (+) Arthritis ,    Abdominal   Peds  Hematology negative hematology ROS (+) Last eliquis 9/29    Anesthesia Other Findings   Reproductive/Obstetrics                             Anesthesia Physical Anesthesia Plan  ASA: 3  Anesthesia Plan: Spinal and Regional   Post-op Pain Management: Regional block* and Minimal or no pain anticipated   Induction:   PONV Risk Score and Plan: 3 and Treatment may vary due to age or medical condition and Propofol infusion  Airway Management Planned: Natural Airway and Nasal Cannula  Additional Equipment: None  Intra-op Plan:   Post-operative Plan:   Informed Consent: I have reviewed the patients History and Physical, chart, labs and discussed the procedure including the risks, benefits and alternatives for the proposed anesthesia with the patient or authorized representative who has indicated his/her understanding and acceptance.     Dental advisory given  Plan Discussed with:   Anesthesia Plan Comments: (See PAT note 12/18/2022,  Christeen Douglas, PA-C Spinal w R adductor canal)       Anesthesia Quick Evaluation

## 2022-12-29 ENCOUNTER — Ambulatory Visit: Payer: Self-pay | Admitting: Student

## 2022-12-29 NOTE — H&P (View-Only) (Signed)
TOTAL KNEE REVISION ADMISSION H&P  Patient is being admitted for right revision total knee arthroplasty.  Subjective:  Chief Complaint:right knee pain.  HPI: Christina Fuller, 57 y.o. female, has a history of pain and functional disability in the right knee(s) due to failed previous arthroplasty and patient has failed non-surgical conservative treatments for greater than 12 weeks to include NSAID's and/or analgesics, flexibility and strengthening excercises, supervised PT with diminished ADL's post treatment, use of assistive devices, and activity modification. The indications for the revision of the total knee arthroplasty are  flexion instability . Onset of symptoms was gradual starting 1 years ago with rapidlly worsening course since that time.  Prior procedures on the right knee(s) include arthroplasty.  Patient currently rates pain in the right knee(s) at 10 out of 10 with activity. There is night pain, worsening of pain with activity and weight bearing, pain that interferes with activities of daily living, pain with passive range of motion, and joint swelling.  Patient has no evidence of  prosthetic loosening, fracture or dislocation  by imaging studies. This condition presents safety issues increasing the risk of falls.  There is no current active infection.  Patient Active Problem List   Diagnosis Date Noted   Osteoarthritis of left knee 04/21/2019   Past Medical History:  Diagnosis Date   Arthritis    Carpal tunnel syndrome    Colon cancer (HCC) 2004   Dysrhythmia    GERD (gastroesophageal reflux disease)    H/O hiatal hernia    Herniated nucleus pulposus    Hypertension    Paroxysmal atrial fibrillation (HCC)    evaluated by [facility]     Past Surgical History:  Procedure Laterality Date   ABDOMINAL HYSTERECTOMY  2007   BACK SURGERY     CARPAL TUNNEL RELEASE  2011   bilateral   CHOLECYSTECTOMY  2004   JOINT REPLACEMENT Right 2015   Total Knee Replacement    LAMINECTOMY  2011   "lower back"   LASIK     PARTIAL COLECTOMY  12/2002   "colon cancer"   PORT-A-CATH REMOVAL  2005   left chest   PORTACATH PLACEMENT  2004   left chest   POSTERIOR FUSION LUMBAR SPINE  07/08/2011   L5-S1   stomach reconstruction  2005   d/t clot post partial colectomy   TONSILLECTOMY AND ADENOIDECTOMY  1971   TOTAL KNEE ARTHROPLASTY Left 04/21/2019   Procedure: TOTAL KNEE ARTHROPLASTY;  Surgeon: Eugenia Mcalpine, MD;  Location: [facility];  Service: Orthopedics;  Laterality: Left;  adductor canal   WRIST ARTHROSCOPY Right 10/21/2021   Procedure: Right wrist arthroscopy scapholunate and triangular fibrocartilage complex debridement;  Surgeon: Gomez Cleverly, MD;  Location: [facility];  Service: Orthopedics;  Laterality: Right;    Current Outpatient Medications  Medication Sig Dispense Refill Last Dose   apixaban (ELIQUIS) 5 MG TABS tablet Take 5 mg by mouth 2 (two) times daily.       atenolol (TENORMIN) 25 MG tablet Take 25 mg by mouth daily.      bisacodyl (DULCOLAX) 5 MG EC tablet Take 5 mg by mouth daily as needed for moderate constipation.      ferrous sulfate 325 (65 FE) MG EC tablet Take 325 mg by mouth daily.      Multiple Vitamin (MULTIVITAMIN WITH MINERALS) TABS tablet Take 1 tablet by mouth daily.      topiramate (TOPAMAX) 50 MG tablet Take 50 mg by mouth daily.  ZEPBOUND 7.5 MG/0.5ML Pen Inject 7.5 mg into the skin once a week.      No current facility-administered medications for this visit.   No Known Allergies  Social History   Tobacco Use   Smoking status: Never   Smokeless tobacco: Never  Substance Use Topics   Alcohol use: No    Family History  Problem Relation Age of Onset   Anesthesia problems Neg Hx       Review of Systems  Musculoskeletal:  Positive for arthralgias, gait problem and joint swelling.  All other systems reviewed and are negative.    Objective:  Physical Exam Constitutional:      Appearance: Normal  appearance.  HENT:     Head: Normocephalic and atraumatic.     Nose: Nose normal.     Mouth/Throat:     Mouth: Mucous membranes are moist.     Pharynx: Oropharynx is clear.  Eyes:     Conjunctiva/sclera: Conjunctivae normal.  Cardiovascular:     Rate and Rhythm: Normal rate and regular rhythm.     Pulses: Normal pulses.     Heart sounds: Normal heart sounds.  Pulmonary:     Effort: Pulmonary effort is normal.     Breath sounds: Normal breath sounds.  Abdominal:     General: Abdomen is flat.     Palpations: Abdomen is soft.  Genitourinary:    Comments: Deferred.  Musculoskeletal:     Cervical back: Normal range of motion and neck supple.     Comments: Examination of the right knee reveals a healed anterior incision. She has mild swelling. No warmth, erythema, or effusion. She has tenderness to palpation over the tibial component. Also has peripatellar retinacular tenderness to palpation. She has tenderness to palpation over the distal IT band. She has tenderness to palpation over the pes insertion. Range of motion is 0 to 110 degrees. Her patella tracks centrally throughout the range of motion. In full extension, there is no instability. In mid flexion and full flexion, she is grossly unstable. Painless logrolling of the hip.  Distally, there is no focal motor or sensory deficit. Palpable pedal pulses.   Skin:    General: Skin is warm and dry.     Capillary Refill: Capillary refill takes less than 2 seconds.  Neurological:     General: No focal deficit present.     Mental Status: She is alert and oriented to person, place, and time.  Psychiatric:        Mood and Affect: Mood normal.        Behavior: Behavior normal.        Thought Content: Thought content normal.        Judgment: Judgment normal.     Vital signs in last 24 hours: @VSRANGES @  Labs:  Estimated body mass index is 32.78 kg/m as calculated from the following:   Height as of 12/18/22: 5' 5.5" (1.664 m).    Weight as of 12/18/22: 90.7 kg.  Imaging Review Plain radiographs demonstrate severe degenerative joint disease of the right knee(s). The overall alignment is neutral.There is no evidence of loosening of the femoral, tibial, and patellar components. The bone quality appears to be adequate for age and reported activity level.    Assessment/Plan:  End stage arthritis, right knee(s) with failed previous arthroplasty.   The patient history, physical examination, clinical judgment of the provider and imaging studies are consistent with end stage degenerative joint disease of the right knee(s), previous total knee arthroplasty. Revision  total knee arthroplasty is deemed medically necessary. The treatment options including medical management, injection therapy, arthroscopy and revision arthroplasty were discussed at length. The risks and benefits of revision total knee arthroplasty were presented and reviewed. The risks due to aseptic loosening, infection, stiffness, patella tracking problems, thromboembolic complications and other imponderables were discussed. The patient acknowledged the explanation, agreed to proceed with the plan and consent was signed. Patient is being admitted for inpatient treatment for surgery, pain control, PT, OT, prophylactic antibiotics, VTE prophylaxis, progressive ambulation and ADL's and discharge planning.The patient is planning to be discharged home with OPPT after a 2-3 night stay.   Therapy Plans: outpatient therapy. 1st PT appointment 01/04/23, [facility]. Currently receiving right wrist OT here.  Disposition: Home with Husband Planned DVT Prophylaxis: Eliquis 5mg  BID DME needed: Platform rolling walker. Ice machine prescription. 3-in-1 prescription.  PCP: Cleared.  Cardiology: Cleared, hold Eliquis 2 days prior to surgery.  TXA: IV Allergies: NDKA.  Anesthesia Concerns: None.  BMI: 32.1 Last HgbA1c: Not diabetic.  Other: - WC - A-fib,  Eliquis, stop Eliquis 3 days prior to surgery.  - History of lumbar fusion. - Zepbound, patient has already stopped  - Hydrocodone 10/325 night only.  - Oxycodone 10mg , zofran, methocarbamol.  - 11/03/22: Hgb 11.6. - 12/18/22: Cr. 0.93, K+ 3.8.

## 2022-12-29 NOTE — H&P (Signed)
TOTAL KNEE REVISION ADMISSION H&P  Patient is being admitted for right revision total knee arthroplasty.  Subjective:  Chief Complaint:right knee pain.  HPI: Christina Fuller, 57 y.o. female, has a history of pain and functional disability in the right knee(s) due to failed previous arthroplasty and patient has failed non-surgical conservative treatments for greater than 12 weeks to include NSAID's and/or analgesics, flexibility and strengthening excercises, supervised PT with diminished ADL's post treatment, use of assistive devices, and activity modification. The indications for the revision of the total knee arthroplasty are  flexion instability . Onset of symptoms was gradual starting 1 years ago with rapidlly worsening course since that time.  Prior procedures on the right knee(s) include arthroplasty.  Patient currently rates pain in the right knee(s) at 10 out of 10 with activity. There is night pain, worsening of pain with activity and weight bearing, pain that interferes with activities of daily living, pain with passive range of motion, and joint swelling.  Patient has no evidence of  prosthetic loosening, fracture or dislocation  by imaging studies. This condition presents safety issues increasing the risk of falls.  There is no current active infection.  Patient Active Problem List   Diagnosis Date Noted   Osteoarthritis of left knee 04/21/2019   Past Medical History:  Diagnosis Date   Arthritis    Carpal tunnel syndrome    Colon cancer (HCC) 2004   Dysrhythmia    GERD (gastroesophageal reflux disease)    H/O hiatal hernia    Herniated nucleus pulposus    Hypertension    Paroxysmal atrial fibrillation (HCC)    evaluated by [facility]     Past Surgical History:  Procedure Laterality Date   ABDOMINAL HYSTERECTOMY  2007   BACK SURGERY     CARPAL TUNNEL RELEASE  2011   bilateral   CHOLECYSTECTOMY  2004   JOINT REPLACEMENT Right 2015   Total Knee Replacement    LAMINECTOMY  2011   "lower back"   LASIK     PARTIAL COLECTOMY  12/2002   "colon cancer"   PORT-A-CATH REMOVAL  2005   left chest   PORTACATH PLACEMENT  2004   left chest   POSTERIOR FUSION LUMBAR SPINE  07/08/2011   L5-S1   stomach reconstruction  2005   d/t clot post partial colectomy   TONSILLECTOMY AND ADENOIDECTOMY  1971   TOTAL KNEE ARTHROPLASTY Left 04/21/2019   Procedure: TOTAL KNEE ARTHROPLASTY;  Surgeon: Eugenia Mcalpine, MD;  Location: [facility];  Service: Orthopedics;  Laterality: Left;  adductor canal   WRIST ARTHROSCOPY Right 10/21/2021   Procedure: Right wrist arthroscopy scapholunate and triangular fibrocartilage complex debridement;  Surgeon: Gomez Cleverly, MD;  Location: [facility];  Service: Orthopedics;  Laterality: Right;    Current Outpatient Medications  Medication Sig Dispense Refill Last Dose   apixaban (ELIQUIS) 5 MG TABS tablet Take 5 mg by mouth 2 (two) times daily.       atenolol (TENORMIN) 25 MG tablet Take 25 mg by mouth daily.      bisacodyl (DULCOLAX) 5 MG EC tablet Take 5 mg by mouth daily as needed for moderate constipation.      ferrous sulfate 325 (65 FE) MG EC tablet Take 325 mg by mouth daily.      Multiple Vitamin (MULTIVITAMIN WITH MINERALS) TABS tablet Take 1 tablet by mouth daily.      topiramate (TOPAMAX) 50 MG tablet Take 50 mg by mouth daily.  ZEPBOUND 7.5 MG/0.5ML Pen Inject 7.5 mg into the skin once a week.      No current facility-administered medications for this visit.   No Known Allergies  Social History   Tobacco Use   Smoking status: Never   Smokeless tobacco: Never  Substance Use Topics   Alcohol use: No    Family History  Problem Relation Age of Onset   Anesthesia problems Neg Hx       Review of Systems  Musculoskeletal:  Positive for arthralgias, gait problem and joint swelling.  All other systems reviewed and are negative.    Objective:  Physical Exam Constitutional:      Appearance: Normal  appearance.  HENT:     Head: Normocephalic and atraumatic.     Nose: Nose normal.     Mouth/Throat:     Mouth: Mucous membranes are moist.     Pharynx: Oropharynx is clear.  Eyes:     Conjunctiva/sclera: Conjunctivae normal.  Cardiovascular:     Rate and Rhythm: Normal rate and regular rhythm.     Pulses: Normal pulses.     Heart sounds: Normal heart sounds.  Pulmonary:     Effort: Pulmonary effort is normal.     Breath sounds: Normal breath sounds.  Abdominal:     General: Abdomen is flat.     Palpations: Abdomen is soft.  Genitourinary:    Comments: Deferred.  Musculoskeletal:     Cervical back: Normal range of motion and neck supple.     Comments: Examination of the right knee reveals a healed anterior incision. She has mild swelling. No warmth, erythema, or effusion. She has tenderness to palpation over the tibial component. Also has peripatellar retinacular tenderness to palpation. She has tenderness to palpation over the distal IT band. She has tenderness to palpation over the pes insertion. Range of motion is 0 to 110 degrees. Her patella tracks centrally throughout the range of motion. In full extension, there is no instability. In mid flexion and full flexion, she is grossly unstable. Painless logrolling of the hip.  Distally, there is no focal motor or sensory deficit. Palpable pedal pulses.   Skin:    General: Skin is warm and dry.     Capillary Refill: Capillary refill takes less than 2 seconds.  Neurological:     General: No focal deficit present.     Mental Status: She is alert and oriented to person, place, and time.  Psychiatric:        Mood and Affect: Mood normal.        Behavior: Behavior normal.        Thought Content: Thought content normal.        Judgment: Judgment normal.     Vital signs in last 24 hours: @VSRANGES @  Labs:  Estimated body mass index is 32.78 kg/m as calculated from the following:   Height as of 12/18/22: 5' 5.5" (1.664 m).    Weight as of 12/18/22: 90.7 kg.  Imaging Review Plain radiographs demonstrate severe degenerative joint disease of the right knee(s). The overall alignment is neutral.There is no evidence of loosening of the femoral, tibial, and patellar components. The bone quality appears to be adequate for age and reported activity level.    Assessment/Plan:  End stage arthritis, right knee(s) with failed previous arthroplasty.   The patient history, physical examination, clinical judgment of the provider and imaging studies are consistent with end stage degenerative joint disease of the right knee(s), previous total knee arthroplasty. Revision  total knee arthroplasty is deemed medically necessary. The treatment options including medical management, injection therapy, arthroscopy and revision arthroplasty were discussed at length. The risks and benefits of revision total knee arthroplasty were presented and reviewed. The risks due to aseptic loosening, infection, stiffness, patella tracking problems, thromboembolic complications and other imponderables were discussed. The patient acknowledged the explanation, agreed to proceed with the plan and consent was signed. Patient is being admitted for inpatient treatment for surgery, pain control, PT, OT, prophylactic antibiotics, VTE prophylaxis, progressive ambulation and ADL's and discharge planning.The patient is planning to be discharged home with OPPT after a 2-3 night stay.   Therapy Plans: outpatient therapy. 1st PT appointment 01/04/23, [facility]. Currently receiving right wrist OT here.  Disposition: Home with Husband Planned DVT Prophylaxis: Eliquis 5mg  BID DME needed: Platform rolling walker. Ice machine prescription. 3-in-1 prescription.  PCP: Cleared.  Cardiology: Cleared, hold Eliquis 2 days prior to surgery.  TXA: IV Allergies: NDKA.  Anesthesia Concerns: None.  BMI: 32.1 Last HgbA1c: Not diabetic.  Other: - WC - A-fib,  Eliquis, stop Eliquis 3 days prior to surgery.  - History of lumbar fusion. - Zepbound, patient has already stopped  - Hydrocodone 10/325 night only.  - Oxycodone 10mg , zofran, methocarbamol.  - 11/03/22: Hgb 11.6. - 12/18/22: Cr. 0.93, K+ 3.8.

## 2022-12-31 ENCOUNTER — Inpatient Hospital Stay (HOSPITAL_COMMUNITY): Payer: No Typology Code available for payment source | Admitting: Physician Assistant

## 2022-12-31 ENCOUNTER — Inpatient Hospital Stay (HOSPITAL_COMMUNITY): Payer: BC Managed Care – PPO

## 2022-12-31 ENCOUNTER — Encounter (HOSPITAL_COMMUNITY)
Admission: RE | Disposition: A | Discharge status: Home / Self Care | Payer: Self-pay | Source: Home / Self Care | Attending: Orthopedic Surgery

## 2022-12-31 ENCOUNTER — Inpatient Hospital Stay (HOSPITAL_COMMUNITY): Payer: No Typology Code available for payment source | Admitting: Anesthesiology

## 2022-12-31 ENCOUNTER — Inpatient Hospital Stay (HOSPITAL_COMMUNITY)
Admission: RE | Admit: 2022-12-31 | Discharge: 2023-01-04 | DRG: 467 | Disposition: A | Discharge status: Home / Self Care | Payer: No Typology Code available for payment source | Attending: Orthopedic Surgery | Admitting: Orthopedic Surgery

## 2022-12-31 ENCOUNTER — Encounter (HOSPITAL_COMMUNITY): Payer: Self-pay | Admitting: Orthopedic Surgery

## 2022-12-31 ENCOUNTER — Other Ambulatory Visit: Payer: Self-pay

## 2022-12-31 DIAGNOSIS — Z9049 Acquired absence of other specified parts of digestive tract: Secondary | ICD-10-CM | POA: Diagnosis not present

## 2022-12-31 DIAGNOSIS — Z79899 Other long term (current) drug therapy: Secondary | ICD-10-CM | POA: Diagnosis not present

## 2022-12-31 DIAGNOSIS — Z85038 Personal history of other malignant neoplasm of large intestine: Secondary | ICD-10-CM

## 2022-12-31 DIAGNOSIS — Y792 Prosthetic and other implants, materials and accessory orthopedic devices associated with adverse incidents: Secondary | ICD-10-CM | POA: Diagnosis present

## 2022-12-31 DIAGNOSIS — Z7901 Long term (current) use of anticoagulants: Secondary | ICD-10-CM

## 2022-12-31 DIAGNOSIS — Z96652 Presence of left artificial knee joint: Secondary | ICD-10-CM | POA: Diagnosis present

## 2022-12-31 DIAGNOSIS — I10 Essential (primary) hypertension: Secondary | ICD-10-CM | POA: Diagnosis present

## 2022-12-31 DIAGNOSIS — T84012A Broken internal right knee prosthesis, initial encounter: Principal | ICD-10-CM

## 2022-12-31 DIAGNOSIS — D62 Acute posthemorrhagic anemia: Secondary | ICD-10-CM | POA: Diagnosis not present

## 2022-12-31 DIAGNOSIS — K219 Gastro-esophageal reflux disease without esophagitis: Secondary | ICD-10-CM | POA: Diagnosis present

## 2022-12-31 DIAGNOSIS — T84092A Other mechanical complication of internal right knee prosthesis, initial encounter: Secondary | ICD-10-CM | POA: Diagnosis present

## 2022-12-31 DIAGNOSIS — I480 Paroxysmal atrial fibrillation: Secondary | ICD-10-CM | POA: Diagnosis present

## 2022-12-31 DIAGNOSIS — Z96651 Presence of right artificial knee joint: Principal | ICD-10-CM

## 2022-12-31 DIAGNOSIS — Z981 Arthrodesis status: Secondary | ICD-10-CM | POA: Diagnosis not present

## 2022-12-31 DIAGNOSIS — Z7985 Long-term (current) use of injectable non-insulin antidiabetic drugs: Secondary | ICD-10-CM

## 2022-12-31 DIAGNOSIS — M25361 Other instability, right knee: Secondary | ICD-10-CM

## 2022-12-31 HISTORY — PX: TOTAL KNEE REVISION: SHX996

## 2022-12-31 SURGERY — TOTAL KNEE REVISION
Anesthesia: Regional | Site: Knee | Laterality: Right

## 2022-12-31 MED ORDER — METOCLOPRAMIDE HCL 5 MG/ML IJ SOLN: 5.0000 mg | Freq: Three times a day (TID) | INTRAMUSCULAR | Status: DC | PRN

## 2022-12-31 MED ORDER — MENTHOL 3 MG MT LOZG: 1.0000 | LOZENGE | OROMUCOSAL | Status: DC | PRN

## 2022-12-31 MED ORDER — TOPIRAMATE 25 MG PO TABS
50.0000 mg | ORAL_TABLET | Freq: Every day | ORAL | Status: DC
  Administered 2023-01-01 – 2023-01-04 (×4): 50 mg via ORAL
  Filled 2022-12-31 (×4): qty 2

## 2022-12-31 MED ORDER — PHENYLEPHRINE 80 MCG/ML (10ML) SYRINGE FOR IV PUSH (FOR BLOOD PRESSURE SUPPORT)
PREFILLED_SYRINGE | INTRAVENOUS | Status: AC
  Filled 2022-12-31: qty 10

## 2022-12-31 MED ORDER — ORAL CARE MOUTH RINSE: 15.0000 mL | Freq: Once | OROMUCOSAL | Status: AC

## 2022-12-31 MED ORDER — FENTANYL CITRATE (PF) 100 MCG/2ML IJ SOLN
INTRAMUSCULAR | Status: DC | PRN
  Administered 2022-12-31 (×2): 50 ug via INTRAVENOUS

## 2022-12-31 MED ORDER — EPINEPHRINE PF 1 MG/ML IJ SOLN
INTRAMUSCULAR | Status: AC
  Filled 2022-12-31: qty 1

## 2022-12-31 MED ORDER — BISACODYL 10 MG RE SUPP: 10.0000 mg | Freq: Every day | RECTAL | Status: DC | PRN

## 2022-12-31 MED ORDER — PHENOL 1.4 % MT LIQD: 1.0000 | OROMUCOSAL | Status: DC | PRN

## 2022-12-31 MED ORDER — ONDANSETRON HCL 4 MG PO TABS: 4.0000 mg | ORAL_TABLET | Freq: Four times a day (QID) | ORAL | Status: DC | PRN

## 2022-12-31 MED ORDER — OXYCODONE HCL 5 MG PO TABS
ORAL_TABLET | ORAL | Status: AC
  Administered 2022-12-31: 5 mg via ORAL
  Filled 2022-12-31: qty 1

## 2022-12-31 MED ORDER — LACTATED RINGERS IV SOLN: INTRAVENOUS | Status: DC

## 2022-12-31 MED ORDER — ACETAMINOPHEN 10 MG/ML IV SOLN: 1000.0000 mg | Freq: Once | INTRAVENOUS | Status: DC | PRN

## 2022-12-31 MED ORDER — ONDANSETRON HCL 4 MG/2ML IJ SOLN: 4.0000 mg | Freq: Once | INTRAMUSCULAR | Status: DC | PRN

## 2022-12-31 MED ORDER — POLYETHYLENE GLYCOL 3350 17 G PO PACK
17.0000 g | PACK | Freq: Every day | ORAL | Status: DC | PRN
  Administered 2023-01-02 – 2023-01-04 (×3): 17 g via ORAL
  Filled 2022-12-31 (×3): qty 1

## 2022-12-31 MED ORDER — ATENOLOL 50 MG PO TABS
25.0000 mg | ORAL_TABLET | Freq: Every day | ORAL | Status: DC
  Administered 2023-01-02 – 2023-01-04 (×3): 25 mg via ORAL
  Filled 2022-12-31 (×4): qty 1

## 2022-12-31 MED ORDER — STERILE WATER FOR IRRIGATION IR SOLN
Status: DC | PRN
  Administered 2022-12-31: 2000 mL

## 2022-12-31 MED ORDER — POVIDONE-IODINE 10 % EX SWAB: 2.0000 | Freq: Once | CUTANEOUS | Status: DC

## 2022-12-31 MED ORDER — METHOCARBAMOL 500 MG IVPB - SIMPLE MED
INTRAVENOUS | Status: AC
  Administered 2022-12-31: 500 mg via INTRAVENOUS
  Filled 2022-12-31: qty 55

## 2022-12-31 MED ORDER — HYDROMORPHONE HCL 1 MG/ML IJ SOLN
INTRAMUSCULAR | Status: AC
  Administered 2022-12-31: 1 mg via INTRAVENOUS
  Filled 2022-12-31: qty 1

## 2022-12-31 MED ORDER — MIDAZOLAM HCL 2 MG/2ML IJ SOLN
INTRAMUSCULAR | Status: DC | PRN
  Administered 2022-12-31: 1 mg via INTRAVENOUS
  Administered 2022-12-31: 2 mg via INTRAVENOUS

## 2022-12-31 MED ORDER — ACETAMINOPHEN 325 MG PO TABS
325.0000 mg | ORAL_TABLET | Freq: Four times a day (QID) | ORAL | Status: DC | PRN
  Administered 2023-01-02 – 2023-01-04 (×3): 650 mg via ORAL
  Filled 2022-12-31 (×4): qty 2

## 2022-12-31 MED ORDER — ROPIVACAINE HCL 5 MG/ML IJ SOLN
INTRAMUSCULAR | Status: DC | PRN
  Administered 2022-12-31: 30 mL via PERINEURAL

## 2022-12-31 MED ORDER — HYDROMORPHONE HCL 1 MG/ML IJ SOLN
0.2500 mg | INTRAMUSCULAR | Status: DC | PRN
  Administered 2022-12-31: 0.5 mg via INTRAVENOUS

## 2022-12-31 MED ORDER — DEXAMETHASONE SODIUM PHOSPHATE 10 MG/ML IJ SOLN
INTRAMUSCULAR | Status: AC
  Filled 2022-12-31: qty 1

## 2022-12-31 MED ORDER — ISOPROPYL ALCOHOL 70 % SOLN
Status: AC
  Filled 2022-12-31: qty 480

## 2022-12-31 MED ORDER — EPHEDRINE SULFATE (PRESSORS) 50 MG/ML IJ SOLN
INTRAMUSCULAR | Status: DC | PRN
Start: 2022-12-31 — End: 2022-12-31
  Administered 2022-12-31 (×8): 5 mg via INTRAVENOUS

## 2022-12-31 MED ORDER — METOCLOPRAMIDE HCL 5 MG PO TABS: 5.0000 mg | ORAL_TABLET | Freq: Three times a day (TID) | ORAL | Status: DC | PRN

## 2022-12-31 MED ORDER — CEFAZOLIN SODIUM-DEXTROSE 2-4 GM/100ML-% IV SOLN
2.0000 g | INTRAVENOUS | Status: AC
  Administered 2022-12-31: 2 g via INTRAVENOUS
  Filled 2022-12-31: qty 100

## 2022-12-31 MED ORDER — SENNA 8.6 MG PO TABS
1.0000 | ORAL_TABLET | Freq: Two times a day (BID) | ORAL | Status: DC
  Administered 2022-12-31 – 2023-01-04 (×8): 8.6 mg via ORAL
  Filled 2022-12-31 (×8): qty 1

## 2022-12-31 MED ORDER — ONDANSETRON HCL 4 MG/2ML IJ SOLN
4.0000 mg | Freq: Four times a day (QID) | INTRAMUSCULAR | Status: DC | PRN
  Administered 2023-01-01: 4 mg via INTRAVENOUS
  Filled 2022-12-31: qty 2

## 2022-12-31 MED ORDER — SODIUM CHLORIDE 0.9 % IV SOLN: INTRAVENOUS | Status: DC

## 2022-12-31 MED ORDER — PROPOFOL 10 MG/ML IV BOLUS
INTRAVENOUS | Status: DC | PRN
  Administered 2022-12-31: 100 ug/kg/min via INTRAVENOUS
  Administered 2022-12-31 (×2): 20 mg via INTRAVENOUS
  Administered 2022-12-31: 50 mg via INTRAVENOUS

## 2022-12-31 MED ORDER — FERROUS SULFATE 325 (65 FE) MG PO TABS
325.0000 mg | ORAL_TABLET | Freq: Every day | ORAL | Status: DC
  Administered 2023-01-01 – 2023-01-04 (×4): 325 mg via ORAL
  Filled 2022-12-31 (×4): qty 1

## 2022-12-31 MED ORDER — PRONTOSAN WOUND IRRIGATION OPTIME
TOPICAL | Status: DC | PRN
Start: 2022-12-31 — End: 2022-12-31
  Administered 2022-12-31: 350 mL via TOPICAL

## 2022-12-31 MED ORDER — SODIUM CHLORIDE 0.9 % IR SOLN
Status: DC | PRN
Start: 2022-12-31 — End: 2022-12-31
  Administered 2022-12-31: 3000 mL
  Administered 2022-12-31: 1000 mL

## 2022-12-31 MED ORDER — ISOPROPYL ALCOHOL 70 % SOLN
Status: DC | PRN
  Administered 2022-12-31: 1 via TOPICAL

## 2022-12-31 MED ORDER — HYDROMORPHONE HCL 1 MG/ML IJ SOLN
0.5000 mg | INTRAMUSCULAR | Status: DC | PRN
  Administered 2023-01-01 (×3): 1 mg via INTRAVENOUS
  Filled 2022-12-31 (×3): qty 1

## 2022-12-31 MED ORDER — DIPHENHYDRAMINE HCL 12.5 MG/5ML PO ELIX: 12.5000 mg | ORAL_SOLUTION | ORAL | Status: DC | PRN

## 2022-12-31 MED ORDER — ONDANSETRON HCL 4 MG/2ML IJ SOLN
INTRAMUSCULAR | Status: DC | PRN
  Administered 2022-12-31: 4 mg via INTRAVENOUS

## 2022-12-31 MED ORDER — MIDAZOLAM HCL 2 MG/2ML IJ SOLN
INTRAMUSCULAR | Status: AC
  Filled 2022-12-31: qty 2

## 2022-12-31 MED ORDER — ACETAMINOPHEN 500 MG PO TABS
1000.0000 mg | ORAL_TABLET | Freq: Four times a day (QID) | ORAL | Status: AC
  Administered 2022-12-31 – 2023-01-01 (×4): 1000 mg via ORAL
  Filled 2022-12-31 (×4): qty 2

## 2022-12-31 MED ORDER — CEFAZOLIN SODIUM-DEXTROSE 2-4 GM/100ML-% IV SOLN
2.0000 g | Freq: Four times a day (QID) | INTRAVENOUS | Status: AC
  Administered 2022-12-31 (×2): 2 g via INTRAVENOUS
  Filled 2022-12-31 (×2): qty 100

## 2022-12-31 MED ORDER — BUPIVACAINE HCL (PF) 0.25 % IJ SOLN
INTRAMUSCULAR | Status: AC
  Filled 2022-12-31: qty 30

## 2022-12-31 MED ORDER — ROPIVACAINE HCL 5 MG/ML IJ SOLN
INTRAMUSCULAR | Status: DC | PRN
Start: 2022-12-31 — End: 2022-12-31
  Administered 2022-12-31: 13 mg via INTRATHECAL

## 2022-12-31 MED ORDER — CHLORHEXIDINE GLUCONATE 0.12 % MT SOLN
15.0000 mL | Freq: Once | OROMUCOSAL | Status: AC
  Administered 2022-12-31: 15 mL via OROMUCOSAL

## 2022-12-31 MED ORDER — APIXABAN 2.5 MG PO TABS
2.5000 mg | ORAL_TABLET | Freq: Two times a day (BID) | ORAL | Status: DC
  Administered 2023-01-01 – 2023-01-04 (×7): 2.5 mg via ORAL
  Filled 2022-12-31 (×7): qty 1

## 2022-12-31 MED ORDER — KETOROLAC TROMETHAMINE 30 MG/ML IJ SOLN
INTRAMUSCULAR | Status: AC
  Filled 2022-12-31: qty 1

## 2022-12-31 MED ORDER — METHOCARBAMOL 500 MG IVPB - SIMPLE MED: 500.0000 mg | Freq: Four times a day (QID) | INTRAVENOUS | Status: DC | PRN

## 2022-12-31 MED ORDER — PHENYLEPHRINE HCL (PRESSORS) 10 MG/ML IV SOLN
INTRAVENOUS | Status: AC
  Filled 2022-12-31: qty 1

## 2022-12-31 MED ORDER — DEXAMETHASONE SODIUM PHOSPHATE 10 MG/ML IJ SOLN
INTRAMUSCULAR | Status: DC | PRN
Start: 2022-12-31 — End: 2022-12-31
  Administered 2022-12-31: 10 mg via INTRAVENOUS

## 2022-12-31 MED ORDER — OXYCODONE HCL 5 MG PO TABS: 5.0000 mg | ORAL_TABLET | Freq: Once | ORAL | Status: DC | PRN

## 2022-12-31 MED ORDER — TRANEXAMIC ACID-NACL 1000-0.7 MG/100ML-% IV SOLN
1000.0000 mg | INTRAVENOUS | Status: AC
  Administered 2022-12-31: 1000 mg via INTRAVENOUS
  Filled 2022-12-31: qty 100

## 2022-12-31 MED ORDER — OXYCODONE HCL 5 MG PO TABS
10.0000 mg | ORAL_TABLET | ORAL | Status: DC | PRN
  Administered 2022-12-31 – 2023-01-01 (×3): 10 mg via ORAL
  Administered 2023-01-01 – 2023-01-02 (×7): 15 mg via ORAL
  Administered 2023-01-03 – 2023-01-04 (×4): 10 mg via ORAL
  Filled 2022-12-31: qty 2
  Filled 2022-12-31 (×2): qty 3
  Filled 2022-12-31: qty 2
  Filled 2022-12-31 (×3): qty 3
  Filled 2022-12-31: qty 2
  Filled 2022-12-31 (×2): qty 3

## 2022-12-31 MED ORDER — PANTOPRAZOLE SODIUM 40 MG PO TBEC
40.0000 mg | DELAYED_RELEASE_TABLET | Freq: Every day | ORAL | Status: DC
  Administered 2023-01-01 – 2023-01-04 (×4): 40 mg via ORAL
  Filled 2022-12-31 (×4): qty 1

## 2022-12-31 MED ORDER — 0.9 % SODIUM CHLORIDE (POUR BTL) OPTIME
TOPICAL | Status: DC | PRN
  Administered 2022-12-31: 1000 mL

## 2022-12-31 MED ORDER — PHENYLEPHRINE 80 MCG/ML (10ML) SYRINGE FOR IV PUSH (FOR BLOOD PRESSURE SUPPORT)
PREFILLED_SYRINGE | INTRAVENOUS | Status: DC | PRN
  Administered 2022-12-31: 160 ug via INTRAVENOUS

## 2022-12-31 MED ORDER — ONDANSETRON HCL 4 MG/2ML IJ SOLN
INTRAMUSCULAR | Status: AC
  Filled 2022-12-31: qty 2

## 2022-12-31 MED ORDER — OXYCODONE HCL 5 MG PO TABS
ORAL_TABLET | ORAL | Status: AC
  Administered 2022-12-31: 10 mg via ORAL
  Filled 2022-12-31: qty 2

## 2022-12-31 MED ORDER — ACETAMINOPHEN 500 MG PO TABS
1000.0000 mg | ORAL_TABLET | Freq: Once | ORAL | Status: AC
  Administered 2022-12-31: 1000 mg via ORAL
  Filled 2022-12-31: qty 2

## 2022-12-31 MED ORDER — ALUM & MAG HYDROXIDE-SIMETH 200-200-20 MG/5ML PO SUSP: 30.0000 mL | ORAL | Status: DC | PRN

## 2022-12-31 MED ORDER — OXYCODONE HCL 5 MG/5ML PO SOLN: 5.0000 mg | Freq: Once | ORAL | Status: DC | PRN

## 2022-12-31 MED ORDER — OXYCODONE HCL 5 MG PO TABS
5.0000 mg | ORAL_TABLET | ORAL | Status: DC | PRN
  Administered 2023-01-01 – 2023-01-04 (×4): 10 mg via ORAL
  Filled 2022-12-31 (×8): qty 2

## 2022-12-31 MED ORDER — DOCUSATE SODIUM 100 MG PO CAPS
100.0000 mg | ORAL_CAPSULE | Freq: Two times a day (BID) | ORAL | Status: DC
  Administered 2022-12-31 – 2023-01-04 (×8): 100 mg via ORAL
  Filled 2022-12-31 (×8): qty 1

## 2022-12-31 MED ORDER — FENTANYL CITRATE (PF) 100 MCG/2ML IJ SOLN
INTRAMUSCULAR | Status: AC
  Filled 2022-12-31: qty 2

## 2022-12-31 MED ORDER — CLONIDINE HCL (ANALGESIA) 100 MCG/ML EP SOLN
EPIDURAL | Status: DC | PRN
Start: 2022-12-31 — End: 2022-12-31
  Administered 2022-12-31: 100 ug

## 2022-12-31 MED ORDER — AMISULPRIDE (ANTIEMETIC) 5 MG/2ML IV SOLN: 10.0000 mg | Freq: Once | INTRAVENOUS | Status: DC | PRN

## 2022-12-31 MED ORDER — SODIUM CHLORIDE (PF) 0.9 % IJ SOLN
INTRAMUSCULAR | Status: AC
  Filled 2022-12-31: qty 30

## 2022-12-31 MED ORDER — HYDROMORPHONE HCL 1 MG/ML IJ SOLN
INTRAMUSCULAR | Status: AC
  Administered 2022-12-31: 0.5 mg via INTRAVENOUS
  Filled 2022-12-31: qty 1

## 2022-12-31 MED ORDER — METHOCARBAMOL 500 MG PO TABS
500.0000 mg | ORAL_TABLET | Freq: Four times a day (QID) | ORAL | Status: DC | PRN
  Administered 2022-12-31 – 2023-01-04 (×7): 500 mg via ORAL
  Filled 2022-12-31 (×7): qty 1

## 2022-12-31 SURGICAL SUPPLY — 99 items
ADH SKN CLS APL DERMABOND .7 (GAUZE/BANDAGES/DRESSINGS) ×2
APL PRP STRL LF DISP 70% ISPRP (MISCELLANEOUS) ×2
AUG FEM POST PS KNEE 7 5 (Joint) ×1 IMPLANT
AUG TIB XS CONE CNTR STRL LF (Insert) ×1 IMPLANT
AUGMENT FEM POST PS KNEE 7 5 (Joint) IMPLANT
BAG COUNTER SPONGE SURGICOUNT (BAG) IMPLANT
BAG DECANTER FOR FLEXI CONT (MISCELLANEOUS) ×4 IMPLANT
BAG SPEC THK2 15X12 ZIP CLS (MISCELLANEOUS) ×1
BAG SPNG CNTER NS LX DISP (BAG)
BAG ZIPLOCK 12X15 (MISCELLANEOUS) ×2 IMPLANT
BLADE OSTEOTOME FLAT 20X11 (BLADE) IMPLANT
BLADE SAW RECIPROCATING 77.5 (BLADE) ×2 IMPLANT
BLADE SAW SGTL 13.0X1.19X90.0M (BLADE) IMPLANT
BLADE SAW SGTL 81X20 HD (BLADE) ×2 IMPLANT
BLADE SURG SZ10 CARB STEEL (BLADE) ×2 IMPLANT
BNDG CMPR 5X4 KNIT ELC UNQ LF (GAUZE/BANDAGES/DRESSINGS) ×1
BNDG CMPR 6 X 5 YARDS HK CLSR (GAUZE/BANDAGES/DRESSINGS) ×1
BNDG CMPR MED 15X6 ELC VLCR LF (GAUZE/BANDAGES/DRESSINGS) ×2
BNDG ELASTIC 4INX 5YD STR LF (GAUZE/BANDAGES/DRESSINGS) ×2 IMPLANT
BNDG ELASTIC 6INX 5YD STR LF (GAUZE/BANDAGES/DRESSINGS) ×2 IMPLANT
BNDG ELASTIC 6X15 VLCR STRL LF (GAUZE/BANDAGES/DRESSINGS) IMPLANT
BONE CEMENT SIMPLEX TOBRAMYCIN (Cement) IMPLANT
CEMENT BONE REFOBACIN R1X40 US (Cement) IMPLANT
CEMENT RESTRICTOR BONE PREP ST (Cement) IMPLANT
CHLORAPREP W/TINT 26 (MISCELLANEOUS) ×4 IMPLANT
CNTNR URN SCR LID CUP LEK RST (MISCELLANEOUS) IMPLANT
COMP FEM CMT PS PLUS 7 RT (Joint) ×1 IMPLANT
COMP TIB FIXED CMT E RT (Joint) ×1 IMPLANT
COMPONENT FEM CMT PS PLUS 7 RT (Joint) IMPLANT
COMPONENT TIB FIXED CMT E RT (Joint) IMPLANT
CONT SPEC 4OZ STRL OR WHT (MISCELLANEOUS) ×2
COVER SURGICAL LIGHT HANDLE (MISCELLANEOUS) ×2 IMPLANT
CUFF TOURN SGL QUICK 34 (TOURNIQUET CUFF) ×2
CUFF TRNQT CYL 34X4.125X (TOURNIQUET CUFF) ×2 IMPLANT
CUFF TRNQT CYL 34X4X40X1 (TOURNIQUET CUFF) IMPLANT
DERMABOND ADVANCED .7 DNX12 (GAUZE/BANDAGES/DRESSINGS) ×4 IMPLANT
DRAPE INCISE IOBAN 66X45 STRL (DRAPES) ×6 IMPLANT
DRAPE SHEET LG 3/4 BI-LAMINATE (DRAPES) ×6 IMPLANT
DRAPE U-SHAPE 47X51 STRL (DRAPES) ×2 IMPLANT
DRSG AQUACEL AG ADV 3.5X10 (GAUZE/BANDAGES/DRESSINGS) ×2 IMPLANT
DRSG TEGADERM 4X4.75 (GAUZE/BANDAGES/DRESSINGS) IMPLANT
ELECT BLADE TIP CTD 4 INCH (ELECTRODE) ×2 IMPLANT
ELECT REM PT RETURN 15FT ADLT (MISCELLANEOUS) ×2 IMPLANT
EVACUATOR 1/8 PVC DRAIN (DRAIN) ×2 IMPLANT
GLOVE BIO SURGEON STRL SZ7 (GLOVE) ×4 IMPLANT
GLOVE BIO SURGEON STRL SZ8.5 (GLOVE) ×4 IMPLANT
GLOVE BIOGEL PI IND STRL 7.5 (GLOVE) ×2 IMPLANT
GLOVE BIOGEL PI IND STRL 8.5 (GLOVE) ×2 IMPLANT
GOWN SPEC L3 XXLG W/TWL (GOWN DISPOSABLE) ×2 IMPLANT
GOWN STRL REUS W/ TWL XL LVL3 (GOWN DISPOSABLE) ×2 IMPLANT
GOWN STRL REUS W/TWL XL LVL3 (GOWN DISPOSABLE) ×1
HANDPIECE INTERPULSE COAX TIP (DISPOSABLE) ×1
HOLDER FOLEY CATH W/STRAP (MISCELLANEOUS) IMPLANT
HOOD PEEL AWAY T7 (MISCELLANEOUS) ×8 IMPLANT
IMPL TAPESTRY BIOINTEGR 40X30 (Mesh General) IMPLANT
INSERT PSN CPS VE RIGHT 12M (Insert) IMPLANT
INSERT TIB PS CMTLS CC XSM (Insert) IMPLANT
INSTR SCRW HEX REV FIX 3.5X48 (ORTHOPEDIC DISPOSABLE SUPPLIES) ×1
INSTRUMENT SCRW HEX REV 3.5X48 (ORTHOPEDIC DISPOSABLE SUPPLIES) IMPLANT
KIT TURNOVER KIT A (KITS) IMPLANT
MANIFOLD NEPTUNE II (INSTRUMENTS) ×2 IMPLANT
MARKER SKIN DUAL TIP RULER LAB (MISCELLANEOUS) ×2 IMPLANT
NDL SPNL 18GX3.5 QUINCKE PK (NEEDLE) ×2 IMPLANT
NEEDLE SPNL 18GX3.5 QUINCKE PK (NEEDLE) ×1
NS IRRIG 1000ML POUR BTL (IV SOLUTION) ×2 IMPLANT
PACK TOTAL KNEE CUSTOM (KITS) ×2 IMPLANT
PADDING CAST ABS COTTON 6X4 NS (CAST SUPPLIES) IMPLANT
PADDING CAST COTTON 6X4 STRL (CAST SUPPLIES) IMPLANT
PIN DRILL HDLS TROCAR 75 4PK (PIN) IMPLANT
PROTECTOR NERVE ULNAR (MISCELLANEOUS) ×2 IMPLANT
RESTRICTOR CEMENT SZ 5 C-STEM (Cement) IMPLANT
SAW OSC TIP CART 19.5X105X1.3 (SAW) ×2 IMPLANT
SCREW HEX HEADED 3.5X27 DISP (ORTHOPEDIC DISPOSABLE SUPPLIES) IMPLANT
SEALER BIPOLAR AQUA 6.0 (INSTRUMENTS) ×2 IMPLANT
SET HNDPC FAN SPRY TIP SCT (DISPOSABLE) ×2 IMPLANT
SET PAD KNEE POSITIONER (MISCELLANEOUS) ×2 IMPLANT
SOLUTION PRONTOSAN WOUND 350ML (IRRIGATION / IRRIGATOR) IMPLANT
SPIKE FLUID TRANSFER (MISCELLANEOUS) IMPLANT
SPONGE DRAIN TRACH 4X4 STRL 2S (GAUZE/BANDAGES/DRESSINGS) ×2 IMPLANT
SPONGE T-LAP 18X18 ~~LOC~~+RFID (SPONGE) IMPLANT
STAPLER VISISTAT 35W (STAPLE) IMPLANT
STEM FEM OFFSET 13X135X6 (Stem) IMPLANT
STEM FEM PERS 15 +135 (Stem) IMPLANT
SUT MNCRL AB 3-0 PS2 18 (SUTURE) ×2 IMPLANT
SUT MON AB 2-0 CT1 36 (SUTURE) ×4 IMPLANT
SUT STRATAFIX PDO 1 14 VIOLET (SUTURE) ×1
SUT STRATFX PDO 1 14 VIOLET (SUTURE) ×1
SUT VIC AB 1 CTX 36 (SUTURE) ×2
SUT VIC AB 1 CTX36XBRD ANBCTR (SUTURE) ×4 IMPLANT
SUT VIC AB 2-0 CT1 27 (SUTURE) ×1
SUT VIC AB 2-0 CT1 TAPERPNT 27 (SUTURE) ×2 IMPLANT
SUTURE STRATFX PDO 1 14 VIOLET (SUTURE) ×2 IMPLANT
TOWER CARTRIDGE SMART MIX (DISPOSABLE) ×4 IMPLANT
TRAY FOLEY MTR SLVR 16FR STAT (SET/KITS/TRAYS/PACK) ×2 IMPLANT
TRAY FOLEY W/BAG SLVR 14FR LF (SET/KITS/TRAYS/PACK) IMPLANT
TUBE SUCTION HIGH CAP CLEAR NV (SUCTIONS) ×2 IMPLANT
WATER STERILE IRR 1000ML POUR (IV SOLUTION) ×2 IMPLANT
WRAP KNEE MAXI GEL POST OP (GAUZE/BANDAGES/DRESSINGS) ×2 IMPLANT
YANKAUER SUCT BULB TIP 10FT TU (MISCELLANEOUS) IMPLANT

## 2022-12-31 NOTE — Interval H&P Note (Signed)
History and Physical Interval Note:  12/31/2022 7:01 AM  Christina Fuller  has presented today for surgery, with the diagnosis of Right  knee flexion instability.  The various methods of treatment have been discussed with the patient and family. After consideration of risks, benefits and other options for treatment, the patient has consented to  Procedure(s) with comments: TOTAL KNEE REVISION (Right) - 250 as a surgical intervention.  The patient's history has been reviewed, patient examined, no change in status, stable for surgery.  I have reviewed the patient's chart and labs.  Questions were answered to the patient's satisfaction.     Iline Oven Paula Busenbark

## 2022-12-31 NOTE — Discharge Instructions (Signed)

## 2022-12-31 NOTE — Op Note (Signed)
OPERATIVE REPORT   12/31/2022  11:22 AM  PATIENT:  Christina Fuller   SURGEON:  Jonette Pesa, MD  ASSISTANT:  Clint Bolder, PA-C   PREOPERATIVE DIAGNOSIS:  Failed right total knee arthroplasty.  POSTOPERATIVE DIAGNOSIS:  Same.  PROCEDURE: 1.  Revision right total knee arthroplasty, femoral and tibial components. 2.  Augmentation of capsulotomy repair with implant.  ANESTHESIA:   MAC. Regional. Spinal.  ANTIBIOTICS: 2 g Ancef.  EXPLANTS: Depuy Attune PS RP total knee system.  IMPLANTS:  Zimmer persona revision femur size 7+ with 5 mm posterolateral augment x 1 and 15 x 135 mm splined stem. Persona revision fixed tibia size E with 13 x 135 mm splined stem, 6 mm offset. Trabecular metal tibial cone, size X-small. Vivacit-E polyethelyene insert, size 12 mm, CPS. Refobacin R bone cement. Embody Tapestry BioIntegrative implant, size 40 x 30 mm, x2.  SPECIMENS:  Femoral interface membrane for aerobic and anaerobic tissue culture.  TUBES AND DRAINS: Medium HV x1.  COMPLICATIONS:  None.  DISPOSITION:  Stable to PACU.  SURGICAL INDICATIONS:  Christina Fuller is a 57 y.o. female was with history of right total knee arthroplasty done over 7 years ago at an outside facility.  She presented with gross flexion instability.  ESR normal at 14 mm/h.  C-reactive protein normal at 3.6 mg/L.  Triple phase bone scan was completed and was noted to be normal, however images were not available for my personal review.  Based on this data and her exam findings, she was indicated for revision right total knee arthroplasty.  The risks, benefits, and alternatives were discussed with the patient. There are risks associated with the surgery including, but not limited to, problems with anesthesia (death), infection, instability (giving out of the joint), dislocation, differences in leg length/angulation/rotation, fracture of bones, loosening or failure of implants, hematoma (blood accumulation)  which may require surgical drainage, blood clots, pulmonary embolism, nerve injury (foot drop and lateral thigh numbness), and blood vessel injury. The patient understands these risks and elects to proceed.  PROCEDURE IN DETAIL: Adductor canal block was obtained in the pre-op holding area. Once inside the operative room, spinal anesthesia was obtained, and a foley catheter was inserted. The patient was then positioned and the lower extremity was prepped and draped in the normal sterile surgical fashion.  A time-out was called verifying side and site of surgery. The patient received IV antibiotics within 60 minutes of beginning the procedure. A tourniquet was applied to the upper thigh.   Esmarch exsanguination was utilized, and the tourniquet was inflated to 340 mmHg.  Previous anterior incision was utilized.  Full-thickness skin flaps were raised.  Medial parapatellar arthrotomy was created.  No gross evidence of infection.  A medial release was performed.  The knee was brought into extension, and the infrapatellar scar was sharply excised with Bovie electrocautery.  I performed a radical synovectomy of the medial gutter, lateral gutter, and suprapatellar pouch.  I inspected the patellar component.  There was mild scar tissue overgrowth which I sharply debrided with Bovie electrocautery.  The patellar component was well-fixed, and therefore maintained.   Using an ACL saw, I disrupted the implant cement interface of the femoral and tibial components.  I then used flexible osteotomes to further loosen the cement implant interface.  The poly liner was removed with an osteotome.  The Shukla knee extraction set was used to remove the distal femoral component followed by the proximal tibial component without any significant bone loss. I  sent a sample of the femoral interface membrane for routine culture.   I sequentially reamed up to a 13 mm reamer on the tibia, and 15 mm reamer on the femur.  Using an  intramedullary cutting guide, I freshened the proximal tibia cut.  I sized the tibia to an E and set the rotation.  I maximized coverage using 6 mm of offset.  I prepared the proximal tibia for a trabecular metal cone.  The trial tibial component was assembled on the back table and inserted.   Using an intramedullary cutting guide, the distal femur cut was freshened.  4-in-1 cutting block was pinned into place with tension at 90 degrees of flexion.  I cut for a 5 mm posterior augment laterally.  Box cut was made.  The femoral component was inserted.   At this point I assessed the flexion and extension gaps, which were well matched.  A 12 mm trial bearing was inserted.  There was excellent varus/valgus stability throughout the range of motion.  The flexion and extension gaps were perfectly matched.  The trial components were removed.  The real components were assembled on the back table.  The knee was copiously irrigated with saline.  The bone was dried with lap sponges.  The tibial cone was inserted to the appropriate depth.  Using hybrid fixation technique, the tibial and femoral components were inserted.  Excess cement was cleared.  Trial poly bearing was placed, and the knee was brought into extension.  Once the cement polymerized, I exchanged the trial poly for the real poly.  The knee was tested for a final time and found to be well-balanced.  The patella tracked centrally using a no thumbs technique.The tourniquet was let down, and meticulous hemostasis was obtained with the Aquamantys and Bovie electrocautery.   The wound was copiously irrigated with Prontosan solution and normal saline using pulse lavage.  A medium Hemovac drain was inserted into the joint.  The fascia was repaired with #1 Vicryl and Stratafix. I elected to augment the repair with Tapestry implant, which was sewn in with 2-0 Monocryl. The remainder of the closure was performed with 2-0 Vicryl for the subcutaneous fat, 2-0 Monocryl for  the deep dermal layer, and skin staples. Dermabond was applied to the skin.  Once the glue was fully dried, an Aquacell Ag and compressive dressing were applied.  The patient was transported to the recovery room in stable condition.  Sponge, needle, and instrument counts were correct at the end of the case x2.  The patient tolerated the procedure well and there were no known complications.   The aquamantis was utilized for this case to help facilitate better hemostasis as patient was felt to be at increased risk of bleeding because of preop anemia.  A oscillating saw tip was utilized for this case to prevent damage to the soft tissue structures such as muscles, ligaments and tendons, and to ensure accurate bone cuts. This patient was at increased risk for above structures due to  minimally invasive approach.  Please note that a surgical assistant was a medical necessity for this procedure in order to perform it in a safe and expeditious manner. Surgical assistant was necessary to retract the ligaments and vital neurovascular structures to prevent injury to them and also necessary for proper positioning of the limb to allow for anatomic placement of the prosthesis.

## 2022-12-31 NOTE — Progress Notes (Signed)
Attempted report 

## 2022-12-31 NOTE — Anesthesia Postprocedure Evaluation (Signed)
Anesthesia Post Note  Patient: Christina Fuller  Procedure(s) Performed: TOTAL KNEE REVISION (Right: Knee)     Patient location during evaluation: Nursing Unit Anesthesia Type: Regional and Spinal Level of consciousness: oriented and awake and alert Pain management: pain level controlled Vital Signs Assessment: post-procedure vital signs reviewed and stable Respiratory status: spontaneous breathing and respiratory function stable Cardiovascular status: blood pressure returned to baseline and stable Postop Assessment: no headache, no backache, no apparent nausea or vomiting and patient able to bend at knees Anesthetic complications: no   No notable events documented.  Last Vitals:  Vitals:   12/31/22 1300 12/31/22 1315  BP: (!) 105/59 (!) 99/49  Pulse: (!) 50 (!) 57  Resp: 12 17  Temp: 36.7 C   SpO2: 99% 100%    Last Pain:  Vitals:   12/31/22 1315  TempSrc:   PainSc: 8                  Trevor Iha

## 2022-12-31 NOTE — Plan of Care (Signed)
  Problem: Education: Goal: Knowledge of the prescribed therapeutic regimen will improve Outcome: Progressing   Problem: Activity: Goal: Ability to avoid complications of mobility impairment will improve Outcome: Progressing   Problem: Pain Management: Goal: Pain level will decrease with appropriate interventions Outcome: Progressing   Problem: Education: Goal: Knowledge of General Education information will improve Description: Including pain rating scale, medication(s)/side effects and non-pharmacologic comfort measures Outcome: Progressing   Problem: Activity: Goal: Risk for activity intolerance will decrease Outcome: Progressing   Problem: Elimination: Goal: Will not experience complications related to bowel motility Outcome: Progressing   Problem: Pain Managment: Goal: General experience of comfort will improve Outcome: Progressing   

## 2022-12-31 NOTE — Transfer of Care (Signed)
Immediate Anesthesia Transfer of Care Note  Patient: Christina Fuller  Procedure(s) Performed: TOTAL KNEE REVISION (Right: Knee)  Patient Location: PACU  Anesthesia Type:MAC and Spinal  Level of Consciousness: awake and alert   Airway & Oxygen Therapy: Patient Spontanous Breathing  Post-op Assessment: Report given to RN and Post -op Vital signs reviewed and stable   Post vital signs: Reviewed  Last Vitals:  Vitals Value Taken Time  BP 112/57 12/31/22 1155  Temp 36.7 C 12/31/22 1155  Pulse 57 12/31/22 1155  Resp 15 12/31/22 1155  SpO2 97 % 12/31/22 1155  Vitals shown include unfiled device data.  Last Pain:  Vitals:   12/31/22 0627  TempSrc:   PainSc: 0-No pain         Complications: No notable events documented.

## 2022-12-31 NOTE — Anesthesia Procedure Notes (Signed)
Anesthesia Regional Block: Adductor canal block   Pre-Anesthetic Checklist: , timeout performed,  Correct Patient, Correct Site, Correct Laterality,  Correct Procedure, Correct Position, site marked,  Risks and benefits discussed,  Surgical consent,  Pre-op evaluation,  At surgeon's request and post-op pain management  Laterality: Lower and Right  Prep: chloraprep       Needles:  Injection technique: Single-shot  Needle Type: Echogenic Needle     Needle Length: 9cm  Needle Gauge: 22     Additional Needles:   Procedures:,,,, ultrasound used (permanent image in chart),,    Narrative:  Start time: 12/31/2022 6:54 AM End time: 12/31/2022 7:02 AM Injection made incrementally with aspirations every 5 mL.  Performed by: Personally  Anesthesiologist: Trevor Iha, MD  Additional Notes: Block assessed prior to surgery. Pt tolerated procedure well.

## 2022-12-31 NOTE — Progress Notes (Signed)
Report called to [facility] LPN

## 2022-12-31 NOTE — Anesthesia Procedure Notes (Addendum)
Spinal  Patient location during procedure: OR Start time: 12/31/2022 8:02 AM End time: 12/31/2022 8:07 AM Reason for block: surgical anesthesia Staffing Performed: anesthesiologist  Anesthesiologist: Trevor Iha, MD Performed by: Trevor Iha, MD Authorized by: Trevor Iha, MD   Preanesthetic Checklist Completed: patient identified, IV checked, risks and benefits discussed, surgical consent, monitors and equipment checked, pre-op evaluation and timeout performed Spinal Block Patient position: sitting Prep: DuraPrep and site prepped and draped Patient monitoring: heart rate, cardiac monitor, continuous pulse ox and blood pressure Approach: midline Location: L2-3 Injection technique: single-shot Needle Needle type: Pencan  Needle gauge: 24 G Needle length: 10 cm Needle insertion depth: 5 cm Assessment Sensory level: T4 Events: CSF return Additional Notes  1 Attempt (s). Pt tolerated procedure well.

## 2023-01-01 LAB — BASIC METABOLIC PANEL
Anion gap: 9 (ref 5–15)
BUN: 8 mg/dL (ref 6–20)
CO2: 21 mmol/L — ABNORMAL LOW (ref 22–32)
Calcium: 8.3 mg/dL — ABNORMAL LOW (ref 8.9–10.3)
Chloride: 106 mmol/L (ref 98–111)
Creatinine, Ser: 0.87 mg/dL (ref 0.44–1.00)
GFR, Estimated: >60 mL/min (ref >60)
Glucose, Bld: 115 mg/dL — ABNORMAL HIGH (ref 70–99)
Potassium: 4.1 mmol/L (ref 3.5–5.1)
Sodium: 136 mmol/L (ref 135–145)

## 2023-01-01 LAB — CBC
HCT: 29.6 % — ABNORMAL LOW (ref 36.0–46.0)
Hemoglobin: 9.6 g/dL — ABNORMAL LOW (ref 12.0–15.0)
MCH: 28.4 pg (ref 26.0–34.0)
MCHC: 32.4 g/dL (ref 30.0–36.0)
MCV: 87.6 fL (ref 80.0–100.0)
Platelets: 234 10*3/uL (ref 150–400)
RBC: 3.38 MIL/uL — ABNORMAL LOW (ref 3.87–5.11)
RDW: 12.2 % (ref 11.5–15.5)
WBC: 7.6 10*3/uL (ref 4.0–10.5)
nRBC: 0 % (ref 0.0–0.2)

## 2023-01-01 NOTE — Plan of Care (Signed)
Problem: Pain Management: Goal: Pain level will decrease with appropriate interventions Outcome: Progressing   Problem: Nutrition: Goal: Adequate nutrition will be maintained Outcome: Progressing   Problem: Coping: Goal: Level of anxiety will decrease Outcome: Progressing   Problem: Pain Managment: Goal: General experience of comfort will improve Outcome: Progressing   Problem: Safety: Goal: Ability to remain free from injury will improve Outcome: Progressing

## 2023-01-01 NOTE — Progress Notes (Addendum)
Subjective:  Patient reports pain as moderate.  Denies N/V/CP/SOB/Abd pain. She reports increased pain this morning and trouble sleeping last night. She denies any tingling or numbness in LE bilaterally.   Objective:   VITALS:   Vitals:   12/31/22 1749 12/31/22 2129 01/01/23 0213 01/01/23 0623  BP: 108/61 (!) 107/58 108/63 (!) 110/53  Pulse: (!) 52 (!) 51  (!) 48  Resp: 15 18 18 19   Temp: (!) 97.5 F (36.4 C) 98 F (36.7 C) 97.6 F (36.4 C) (!) 97.5 F (36.4 C)  TempSrc:      SpO2: 100% 100% 100% 100%  Weight:      Height:        Patient sitting up in bed. NAD.  Neurologically intact ABD soft Neurovascular intact Sensation intact distally Intact pulses distally Dorsiflexion/Plantar flexion intact Incision: dressing C/D/I No cellulitis present Compartment soft Hemovac dressing C/D/I. Output 280 cc bloody fluid. Continue drain until output less than 30cc per shift.   Lab Results  Component Value Date   WBC 7.6 01/01/2023   HGB 9.6 (L) 01/01/2023   HCT 29.6 (L) 01/01/2023   MCV 87.6 01/01/2023   PLT 234 01/01/2023   BMET    Component Value Date/Time   NA 136 01/01/2023 0314   K 4.1 01/01/2023 0314   CL 106 01/01/2023 0314   CO2 21 (L) 01/01/2023 0314   GLUCOSE 115 (H) 01/01/2023 0314   BUN 8 01/01/2023 0314   CREATININE 0.87 01/01/2023 0314   CALCIUM 8.3 (L) 01/01/2023 0314   GFRNONAA >60 01/01/2023 0314   Results for orders placed or performed during the hospital encounter of 12/31/22  Aerobic/Anaerobic Culture w Gram Stain (surgical/deep wound)     Status: None (Preliminary result)   Collection Time: 12/31/22  9:14 AM   Specimen: Synovial, Right Knee; Body Fluid  Result Value Ref Range Status   Specimen Description   Final    KNEE RIGHT  FEMORAL INTERFACE MEMBRANE FOR TISSUE CULTURES Performed at [facility], [address], [place], [place] [address]    Special Requests   Final    NONE Performed at [facility], [address], [place], [place] [address]    Gram Stain NO WBC SEEN NO ORGANISMS SEEN   Final   Culture   Final    NO GROWTH < 24 HOURS Performed at [facility], [address] [place], [place] [address]    Report Status PENDING  Incomplete     Assessment/Plan: 1 Day Post-Op   Principal Problem:   Failed total right knee replacement (HCC) Active Problems:   S/P revision of total knee, right  ABLA. Hemoglobin 9.6. Stable. Continue to monitor.   WBAT with walker DVT ppx:  Eliquis , SCDs, TEDS PO pain control PT/OT: PT is in the room with patient now. We will see how she does with PT today.  Dispo:  - Difficulty with pain control today. We will see how she does with PT.  - Continue hemovac drain until output less than 30cc per shift. Drain needs to be removed prior to discharge.  - Continue to monitor intraoperative cultures. Gram stain negative. No growth noted.  - Discharge pending PT clearance, pain control, and hemovac drain output.    Clois Dupes, PA-C 01/01/2023, 8:20 AM   [facility]  [place] [address], [address], [place], [place] [address] Phone: [phone] [email or web address] Facebook  [employer]

## 2023-01-01 NOTE — Progress Notes (Signed)
Physical Therapy Treatment Patient Details Name: Christina Fuller MRN: [id number] DOB: November 20, 1965 Today's Date: 01/01/2023   History of Present Illness Pt s/p R TKR revision and with hx of bil TKR, colon CA, PAF and lumbar fusion.    PT Comments  Pt agreeable to therex but requiring increased time and rest breaks for task completion.  OOB deferred this pm 2* elevated pain level.    If plan is discharge home, recommend the following: A lot of help with walking and/or transfers;A little help with bathing/dressing/bathroom;Assistance with cooking/housework;Assist for transportation;Help with stairs or ramp for entrance   Can travel by private vehicle        Equipment Recommendations  None recommended by PT    Recommendations for Other Services       Precautions / Restrictions Precautions Precautions: Knee;Fall Restrictions Weight Bearing Restrictions: No Other Position/Activity Restrictions: WBAT     Mobility  Bed Mobility Overal bed mobility: Needs Assistance Bed Mobility: Supine to Sit     Supine to sit: Min assist, Mod assist     General bed mobility comments: cues for sequence and use of L LE to self assist    Transfers Overall transfer level: Needs assistance Equipment used: Rolling walker (2 wheels) Transfers: Sit to/from Stand, Bed to chair/wheelchair/BSC Sit to Stand: Min assist, Mod assist, From elevated surface   Step pivot transfers: Min assist, Mod assist       General transfer comment: cues for LE management and use of UEs to self assist; step pvt bed to chair    Ambulation/Gait               General Gait Details: STep pvt to chair only 2* pain   Stairs             Wheelchair Mobility     Tilt Bed    Modified Rankin (Stroke Patients Only)       Balance Overall balance assessment: Needs assistance Sitting-balance support: No upper extremity supported, Feet supported Sitting balance-Leahy Scale: Fair     Standing balance  support: Bilateral upper extremity supported Standing balance-Leahy Scale: Poor                              Cognition Arousal: Alert Behavior During Therapy: WFL for tasks assessed/performed Overall Cognitive Status: Within Functional Limits for tasks assessed                                          Exercises Total Joint Exercises Ankle Circles/Pumps: AROM, Both, 15 reps, Supine Quad Sets: AROM, Both, Supine, 10 reps Heel Slides: AAROM, Right, 10 reps, Supine Straight Leg Raises: AAROM, Right, 5 reps, Supine    General Comments        Pertinent Vitals/Pain Pain Assessment Pain Assessment: 0-10 Pain Score: 8  Pain Location: R knee with activity Pain Descriptors / Indicators: Aching, Grimacing, Sore Pain Intervention(s): Limited activity within patient's tolerance, Monitored during session, Premedicated before session, Ice applied    Home Living Family/patient expects to be discharged to:: Private residence Living Arrangements: Spouse/significant other Available Help at Discharge: Family Type of Home: House Home Access: Stairs to enter Entrance Stairs-Rails: Right;Left;Can reach both Entrance Stairs-Number of Steps: 3 Alternate Level Stairs-Number of Steps: flight Home Layout: Two level Home Equipment: Agricultural consultant (2 wheels);Cane - single point;BSC/3in1  Prior Function            PT Goals (current goals can now be found in the care plan section) Acute Rehab PT Goals Patient Stated Goal: REgain IND PT Goal Formulation: With patient Time For Goal Achievement: 01/15/23 Potential to Achieve Goals: Good Progress towards PT goals: Not progressing toward goals - comment (pain limited)    Frequency    7X/week      PT Plan      Co-evaluation              AM-PAC PT "6 Clicks" Mobility   Outcome Measure  Help needed turning from your back to your side while in a flat bed without using bedrails?: A Lot Help needed  moving from lying on your back to sitting on the side of a flat bed without using bedrails?: A Lot Help needed moving to and from a bed to a chair (including a wheelchair)?: A Lot Help needed standing up from a chair using your arms (e.g., wheelchair or bedside chair)?: A Lot Help needed to walk in hospital room?: Total Help needed climbing 3-5 steps with a railing? : Total 6 Click Score: 10    End of Session Equipment Utilized During Treatment: Gait belt Activity Tolerance: Patient limited by pain Patient left: in bed;with call bell/phone within reach;with bed alarm set;with family/visitor present Nurse Communication: Mobility status PT Visit Diagnosis: Difficulty in walking, not elsewhere classified (R26.2)     Time: 1410-1431 PT Time Calculation (min) (ACUTE ONLY): 21 min  Charges:    $Therapeutic Exercise: 8-22 mins $Therapeutic Activity: 8-22 mins PT General Charges $$ ACUTE PT VISIT: 1 Visit                     Mauro Kaufmann PT Acute Rehabilitation Services Pager [phone] Office [phone]    Roby Spalla 01/01/2023, 3:14 PM

## 2023-01-01 NOTE — TOC Transition Note (Signed)
Transition of Care [facility]) - CM/SW Discharge Note   Patient Details  Name: Christina Fuller MRN: [id number] Date of Birth: Jun 27, 1965  Transition of Care [facility] CM/SW Contact:  Amada Jupiter, LCSW Phone Number: 01/01/2023, 9:41 AM   Clinical Narrative:     Met with pt who confirms that all DME ordered via [employer] has been delivered to her home.  OPPT already arranged with [facility].  No TOC needs.  Final next level of care: OP Rehab Barriers to Discharge: No Barriers Identified   Patient Goals and CMS Choice      Discharge Placement                         Discharge Plan and Services Additional resources added to the After Visit Summary for                  DME Arranged: N/A DME Agency: NA                  Social Determinants of Health (SDOH) Interventions SDOH Screenings   Food Insecurity: No Food Insecurity (12/31/2022)  Housing: Low Risk  (12/31/2022)  Transportation Needs: No Transportation Needs (12/31/2022)  Utilities: Not At Risk (12/31/2022)  Financial Resource Strain: Low Risk  (04/07/2022)   Received from [facility]  Physical Activity: Unknown (03/03/2022)   Received from [facility], [facility]  Social Connections: Somewhat Isolated (03/03/2022)   Received from [facility], [facility]  Stress: No Stress Concern Present (03/03/2022)   Received from [facility], [facility]  Tobacco Use: Low Risk  (12/31/2022)     Readmission Risk Interventions    01/01/2023    9:40 AM  Readmission Risk Prevention Plan  Post Dischage Appt Complete  Medication Screening Complete  Transportation Screening Complete

## 2023-01-01 NOTE — Evaluation (Signed)
Physical Therapy Evaluation Patient Details Name: Christina Fuller MRN: [id number] DOB: [birth year] Today's Date: 01/01/2023  History of Present Illness  Pt s/p R TKR revision and with hx of bil TKR, colon CA, PAF and lumbar fusion.  Clinical Impression  Pt admitted as above and presenting with decreased R LE strength/ROM and post op pain limiting functional mobility.  Pt should progress to dc home with family assist and reports first OP PT scheduled for 01/04/23.        If plan is discharge home, recommend the following: A lot of help with walking and/or transfers;A little help with bathing/dressing/bathroom;Assistance with cooking/housework;Assist for transportation;Help with stairs or ramp for entrance   Can travel by private vehicle        Equipment Recommendations None recommended by PT  Recommendations for Other Services       Functional Status Assessment Patient has had a recent decline in their functional status and demonstrates the ability to make significant improvements in function in a reasonable and predictable amount of time.     Precautions / Restrictions Precautions Precautions: Knee;Fall Restrictions Weight Bearing Restrictions: No Other Position/Activity Restrictions: WBAT      Mobility  Bed Mobility Overal bed mobility: Needs Assistance Bed Mobility: Supine to Sit     Supine to sit: Min assist, Mod assist     General bed mobility comments: cues for sequence and use of L LE to self assist    Transfers Overall transfer level: Needs assistance Equipment used: Rolling walker (2 wheels) Transfers: Sit to/from Stand, Bed to chair/wheelchair/BSC Sit to Stand: Min assist, Mod assist, From elevated surface   Step pivot transfers: Min assist, Mod assist       General transfer comment: cues for LE management and use of UEs to self assist; step pvt bed to chair    Ambulation/Gait               General Gait Details: STep pvt to chair only 2*  pain  Stairs            Wheelchair Mobility     Tilt Bed    Modified Rankin (Stroke Patients Only)       Balance Overall balance assessment: Needs assistance Sitting-balance support: No upper extremity supported, Feet supported Sitting balance-Leahy Scale: Fair     Standing balance support: Bilateral upper extremity supported Standing balance-Leahy Scale: Poor                               Pertinent Vitals/Pain Pain Assessment Pain Assessment: 0-10 Pain Score: 8  Pain Location: R knee with activity Pain Descriptors / Indicators: Aching, Grimacing, Sore Pain Intervention(s): Limited activity within patient's tolerance, Monitored during session, Premedicated before session, Ice applied    Home Living Family/patient expects to be discharged to:: Private residence Living Arrangements: Spouse/significant other Available Help at Discharge: Family Type of Home: House Home Access: Stairs to enter Entrance Stairs-Rails: Right;Left;Can reach both Entrance Stairs-Number of Steps: 3 Alternate Level Stairs-Number of Steps: flight Home Layout: Two level Home Equipment: Agricultural consultant (2 wheels);Cane - single point;BSC/3in1      Prior Function Prior Level of Function : Independent/Modified Independent                     Extremity/Trunk Assessment   Upper Extremity Assessment Upper Extremity Assessment: Overall WFL for tasks assessed    Lower Extremity Assessment Lower Extremity Assessment: RLE deficits/detail RLE Deficits /  Details: 2/5 quads with AAROM at knee -5 - 25    Cervical / Trunk Assessment Cervical / Trunk Assessment: Normal  Communication   Communication Communication: No apparent difficulties  Cognition Arousal: Alert Behavior During Therapy: WFL for tasks assessed/performed Overall Cognitive Status: Within Functional Limits for tasks assessed                                          General Comments       Exercises Total Joint Exercises Ankle Circles/Pumps: AROM, Both, 15 reps, Supine Quad Sets: AROM, Both, 5 reps, Supine Heel Slides: AAROM, Right, 10 reps, Supine Straight Leg Raises: AAROM, Right, 5 reps, Supine   Assessment/Plan    PT Assessment Patient needs continued PT services  PT Problem List Decreased strength;Decreased range of motion;Decreased knowledge of use of DME;Decreased mobility;Decreased activity tolerance;Decreased balance;Pain       PT Treatment Interventions DME instruction;Gait training;Stair training;Functional mobility training;Therapeutic activities;Therapeutic exercise;Patient/family education    PT Goals (Current goals can be found in the Care Plan section)  Acute Rehab PT Goals Patient Stated Goal: REgain IND PT Goal Formulation: With patient Time For Goal Achievement: 01/15/23 Potential to Achieve Goals: Good    Frequency 7X/week     Co-evaluation               AM-PAC PT "6 Clicks" Mobility  Outcome Measure Help needed turning from your back to your side while in a flat bed without using bedrails?: A Lot Help needed moving from lying on your back to sitting on the side of a flat bed without using bedrails?: A Lot Help needed moving to and from a bed to a chair (including a wheelchair)?: A Lot Help needed standing up from a chair using your arms (e.g., wheelchair or bedside chair)?: A Lot Help needed to walk in hospital room?: Total Help needed climbing 3-5 steps with a railing? : Total 6 Click Score: 10    End of Session Equipment Utilized During Treatment: Gait belt Activity Tolerance: Patient limited by pain Patient left: in chair;with call bell/phone within reach;with chair alarm set Nurse Communication: Mobility status PT Visit Diagnosis: Difficulty in walking, not elsewhere classified (R26.2)    Time: 7829-5621 PT Time Calculation (min) (ACUTE ONLY): 36 min   Charges:   PT Evaluation $PT Eval Low Complexity: 1 Low PT  Treatments $Therapeutic Activity: 8-22 mins PT General Charges $$ ACUTE PT VISIT: 1 Visit         Mauro Kaufmann PT Acute Rehabilitation Services Pager [phone] Office [phone]   Shalyn Koral 01/01/2023, 1:49 PM

## 2023-01-02 LAB — BASIC METABOLIC PANEL
Anion gap: 7 (ref 5–15)
BUN: 9 mg/dL (ref 6–20)
CO2: 24 mmol/L (ref 22–32)
Calcium: 7.9 mg/dL — ABNORMAL LOW (ref 8.9–10.3)
Chloride: 103 mmol/L (ref 98–111)
Creatinine, Ser: 0.94 mg/dL (ref 0.44–1.00)
GFR, Estimated: >60 mL/min (ref >60)
Glucose, Bld: 105 mg/dL — ABNORMAL HIGH (ref 70–99)
Potassium: 3.6 mmol/L (ref 3.5–5.1)
Sodium: 134 mmol/L — ABNORMAL LOW (ref 135–145)

## 2023-01-02 LAB — CBC
HCT: 27.8 % — ABNORMAL LOW (ref 36.0–46.0)
Hemoglobin: 9.1 g/dL — ABNORMAL LOW (ref 12.0–15.0)
MCH: 28.5 pg (ref 26.0–34.0)
MCHC: 32.7 g/dL (ref 30.0–36.0)
MCV: 87.1 fL (ref 80.0–100.0)
Platelets: 229 10*3/uL (ref 150–400)
RBC: 3.19 MIL/uL — ABNORMAL LOW (ref 3.87–5.11)
RDW: 12.4 % (ref 11.5–15.5)
WBC: 7 10*3/uL (ref 4.0–10.5)
nRBC: 0 % (ref 0.0–0.2)

## 2023-01-02 MED ORDER — GABAPENTIN 300 MG PO CAPS
300.0000 mg | ORAL_CAPSULE | Freq: Three times a day (TID) | ORAL | Status: DC
  Administered 2023-01-02 (×3): 300 mg via ORAL
  Filled 2023-01-02 (×5): qty 1

## 2023-01-02 NOTE — Progress Notes (Signed)
Physical Therapy Treatment Patient Details Name: Christina Fuller MRN: [id number] DOB: Jun 21, 1965 Today's Date: 01/02/2023   History of Present Illness Pt s/p R TKR revision and with hx of bil TKR, colon CA, PAF and lumbar fusion.    PT Comments  Pt performed therex with assist and multiple rests 2* c/o pain despite premed.  Gait deferred 2* lethargy - ? Meds.  Pt states she wants to go home tomorrow and educated on need to move more and better and reminded pt of number of stairs she has at home.  Will follow up in am.    If plan is discharge home, recommend the following: A lot of help with walking and/or transfers;A little help with bathing/dressing/bathroom;Assistance with cooking/housework;Assist for transportation;Help with stairs or ramp for entrance   Can travel by private vehicle        Equipment Recommendations  None recommended by PT    Recommendations for Other Services       Precautions / Restrictions Precautions Precautions: Knee;Fall Restrictions Weight Bearing Restrictions: No Other Position/Activity Restrictions: WBAT     Mobility  Bed Mobility               General bed mobility comments: UP in chair and requests to stay in same    Transfers Overall transfer level: Needs assistance Equipment used: Rolling walker (2 wheels) Transfers: Sit to/from Stand Sit to Stand: Min assist, Mod assist           General transfer comment: cues for LE management and use of UEs to self assist    Ambulation/Gait Ambulation/Gait assistance: Min assist Gait Distance (Feet): 32 Feet Assistive device: Rolling walker (2 wheels) Gait Pattern/deviations: Step-to pattern, Decreased step length - left, Decreased step length - right, Shuffle, Trunk flexed Gait velocity: decr     General Gait Details: INcreased time with cues for posture, sequence and position from [employer]             Wheelchair Mobility     Tilt Bed    Modified Rankin (Stroke  Patients Only)       Balance Overall balance assessment: Needs assistance Sitting-balance support: No upper extremity supported, Feet supported Sitting balance-Leahy Scale: Fair     Standing balance support: Bilateral upper extremity supported Standing balance-Leahy Scale: Poor                              Cognition Arousal: Lethargic, Suspect due to medications Behavior During Therapy: WFL for tasks assessed/performed Overall Cognitive Status: Within Functional Limits for tasks assessed                                          Exercises Total Joint Exercises Ankle Circles/Pumps: AROM, Both, 15 reps, Supine Quad Sets: AROM, Both, Supine, 10 reps Heel Slides: AAROM, Right, 10 reps, Supine Straight Leg Raises: AAROM, Right, Supine, 10 reps    General Comments        Pertinent Vitals/Pain Pain Assessment Pain Assessment: 0-10 Pain Score: 8  Pain Location: R knee with activity Pain Descriptors / Indicators: Aching, Grimacing, Sore Pain Intervention(s): Monitored during session, Limited activity within patient's tolerance, Premedicated before session    Home Living  Prior Function            PT Goals (current goals can now be found in the care plan section) Acute Rehab PT Goals Patient Stated Goal: REgain IND PT Goal Formulation: With patient Time For Goal Achievement: 01/15/23 Potential to Achieve Goals: Good Progress towards PT goals: Progressing toward goals    Frequency    7X/week      PT Plan      Co-evaluation              AM-PAC PT "6 Clicks" Mobility   Outcome Measure  Help needed turning from your back to your side while in a flat bed without using bedrails?: A Lot Help needed moving from lying on your back to sitting on the side of a flat bed without using bedrails?: A Lot Help needed moving to and from a bed to a chair (including a wheelchair)?: A Lot Help needed  standing up from a chair using your arms (e.g., wheelchair or bedside chair)?: A Little Help needed to walk in hospital room?: A Little Help needed climbing 3-5 steps with a railing? : A Lot 6 Click Score: 14    End of Session   Activity Tolerance: Patient limited by pain;Other (comment) (lethargy) Patient left: in chair;with call bell/phone within reach;with chair alarm set Nurse Communication: Mobility status PT Visit Diagnosis: Difficulty in walking, not elsewhere classified (R26.2)     Time: 1656-1710 PT Time Calculation (min) (ACUTE ONLY): 14 min  Charges:    $Therapeutic Exercise: 8-22 mins PT General Charges $$ ACUTE PT VISIT: 1 Visit                     Mauro Kaufmann PT Acute Rehabilitation Services Pager [phone] Office [phone]    Harvest Deist 01/02/2023, 5:11 PM

## 2023-01-02 NOTE — Progress Notes (Addendum)
Subjective: 2 Days Post-Op Procedure(s) (LRB): TOTAL KNEE REVISION (Right) Patient reports pain as moderate and severe.   Noting more pain today at the knee. No other c/o. Denies numbness, tingling, CP, SOB, N/V.   Objective: Vital signs in last 24 hours: Temp:  [97.5 F (36.4 C)-98.3 F (36.8 C)] 98.3 F (36.8 C) (10/05 0602) Pulse Rate:  [52-80] 72 (10/05 0851) Resp:  [14-18] 16 (10/05 0602) BP: (114-154)/(62-69) 154/69 (10/05 0602) SpO2:  [81 %-100 %] 99 % (10/05 0602)  Intake/Output from previous day: 10/04 0701 - 10/05 0700 In: 1562.3 [P.O.:1440; I.V.:122.3] Out: 1035 [Urine:850; Drains:185] Intake/Output this shift: No intake/output data recorded.  Recent Labs    01/01/23 0314 01/02/23 0334  HGB 9.6* 9.1*   Recent Labs    01/01/23 0314 01/02/23 0334  WBC 7.6 7.0  RBC 3.38* 3.19*  HCT 29.6* 27.8*  PLT 234 229   Recent Labs    01/01/23 0314 01/02/23 0334  NA 136 134*  K 4.1 3.6  CL 106 103  CO2 21* 24  BUN 8 9  CREATININE 0.87 0.94  GLUCOSE 115* 105*  CALCIUM 8.3* 7.9*   No results for input(s): "LABPT", "INR" in the last 72 hours.  Neurologically intact ABD soft Neurovascular intact Sensation intact distally Intact pulses distally Dorsiflexion/Plantar flexion intact Incision: dressing C/D/I and no drainage No cellulitis present Compartment soft No sign of DVT   Assessment/Plan: 2 Days Post-Op Procedure(s) (LRB): TOTAL KNEE REVISION (Right) Advance diet Up with therapy D/C IV fluids Drain at 50cc output from 5am today, continue until <30cc per shift Continue to watch intra-op cx Continue Eliquis for DVT ppx Will work on pain control - added gabapentin. Could otherwise consider switch from OxyIR to PO dilaudid. Anticipate D/C tomorrow as long as pain improved, passing PT, and drain output decreases   Dorothy Spark 01/02/2023, 9:26 AM

## 2023-01-02 NOTE — Progress Notes (Signed)
Physical Therapy Treatment Patient Details Name: Christina Fuller MRN: [id number] DOB: 1965-04-05 Today's Date: 01/02/2023   History of Present Illness Pt s/p R TKR revision and with hx of bil TKR, colon CA, PAF and lumbar fusion.    PT Comments  Pt is cooperative but requiring increased time and encouragement to ambulate limited distance in hall.  Pt pain limited but pleased with progress this am.    If plan is discharge home, recommend the following: A lot of help with walking and/or transfers;A little help with bathing/dressing/bathroom;Assistance with cooking/housework;Assist for transportation;Help with stairs or ramp for entrance   Can travel by private vehicle        Equipment Recommendations  None recommended by PT    Recommendations for Other Services       Precautions / Restrictions Precautions Precautions: Knee;Fall Restrictions Weight Bearing Restrictions: No Other Position/Activity Restrictions: WBAT     Mobility  Bed Mobility               General bed mobility comments: UP in chair and returns to same    Transfers Overall transfer level: Needs assistance Equipment used: Rolling walker (2 wheels) Transfers: Sit to/from Stand Sit to Stand: Min assist, Mod assist           General transfer comment: cues for LE management and use of UEs to self assist    Ambulation/Gait Ambulation/Gait assistance: Min assist Gait Distance (Feet): 32 Feet Assistive device: Rolling walker (2 wheels) Gait Pattern/deviations: Step-to pattern, Decreased step length - left, Decreased step length - right, Shuffle, Trunk flexed Gait velocity: decr     General Gait Details: INcreased time with cues for posture, sequence and position from [employer]             Wheelchair Mobility     Tilt Bed    Modified Rankin (Stroke Patients Only)       Balance Overall balance assessment: Needs assistance Sitting-balance support: No upper extremity supported,  Feet supported Sitting balance-Leahy Scale: Fair     Standing balance support: Bilateral upper extremity supported Standing balance-Leahy Scale: Poor                              Cognition Arousal: Alert Behavior During Therapy: WFL for tasks assessed/performed Overall Cognitive Status: Within Functional Limits for tasks assessed                                          Exercises      General Comments        Pertinent Vitals/Pain Pain Assessment Pain Assessment: 0-10 Pain Score: 8  Pain Location: R knee with activity Pain Descriptors / Indicators: Aching, Grimacing, Sore Pain Intervention(s): Limited activity within patient's tolerance, Monitored during session, Premedicated before session, Ice applied    Home Living                          Prior Function            PT Goals (current goals can now be found in the care plan section) Acute Rehab PT Goals Patient Stated Goal: REgain IND PT Goal Formulation: With patient Time For Goal Achievement: 01/15/23 Potential to Achieve Goals: Good Progress towards PT goals: Progressing toward goals    Frequency  7X/week      PT Plan      Co-evaluation              AM-PAC PT "6 Clicks" Mobility   Outcome Measure  Help needed turning from your back to your side while in a flat bed without using bedrails?: A Lot Help needed moving from lying on your back to sitting on the side of a flat bed without using bedrails?: A Lot Help needed moving to and from a bed to a chair (including a wheelchair)?: A Lot Help needed standing up from a chair using your arms (e.g., wheelchair or bedside chair)?: A Little Help needed to walk in hospital room?: A Little Help needed climbing 3-5 steps with a railing? : A Lot 6 Click Score: 14    End of Session Equipment Utilized During Treatment: Gait belt Activity Tolerance: Patient limited by pain Patient left: in chair;with call  bell/phone within reach;with chair alarm set Nurse Communication: Mobility status PT Visit Diagnosis: Difficulty in walking, not elsewhere classified (R26.2)     Time: 3086-5784 PT Time Calculation (min) (ACUTE ONLY): 22 min  Charges:    $Gait Training: 8-22 mins PT General Charges $$ ACUTE PT VISIT: 1 Visit                     Mauro Kaufmann PT Acute Rehabilitation Services Pager [phone] Office [phone]    Christina Fuller 01/02/2023, 12:00 PM

## 2023-01-03 NOTE — Progress Notes (Signed)
Physical Therapy Treatment Patient Details Name: Christina Fuller MRN: [id number] DOB: 07-14-1965 Today's Date: 01/03/2023   History of Present Illness Pt s/p R TKR revision and with hx of bil TKR, colon CA, PAF and lumbar fusion.    PT Comments  Pt very cooperative and with noted improvement in activity tolerance this am.  Pt hopeful for dc home tomorrow.    If plan is discharge home, recommend the following: A little help with bathing/dressing/bathroom;Assistance with cooking/housework;Assist for transportation;Help with stairs or ramp for entrance;A little help with walking and/or transfers   Can travel by private vehicle        Equipment Recommendations  None recommended by PT    Recommendations for Other Services       Precautions / Restrictions Precautions Precautions: Knee;Fall Precaution Booklet Issued: No Restrictions Weight Bearing Restrictions: No Other Position/Activity Restrictions: WBAT     Mobility  Bed Mobility               General bed mobility comments: UP in chair and requests to stay in same    Transfers Overall transfer level: Needs assistance Equipment used: Rolling walker (2 wheels) Transfers: Sit to/from Stand Sit to Stand: Min assist           General transfer comment: cues for LE management and use of UEs to self assist    Ambulation/Gait Ambulation/Gait assistance: Min assist Gait Distance (Feet): 64 Feet Assistive device: Rolling walker (2 wheels) Gait Pattern/deviations: Step-to pattern, Decreased step length - left, Decreased step length - right, Shuffle, Trunk flexed Gait velocity: decr     General Gait Details: INcreased time with cues for posture, sequence and position from [employer]             Wheelchair Mobility     Tilt Bed    Modified Rankin (Stroke Patients Only)       Balance Overall balance assessment: Needs assistance Sitting-balance support: No upper extremity supported, Feet  supported Sitting balance-Leahy Scale: Good     Standing balance support: Single extremity supported Standing balance-Leahy Scale: Poor                              Cognition Arousal: Lethargic, Suspect due to medications (but rousable) Behavior During Therapy: WFL for tasks assessed/performed Overall Cognitive Status: Within Functional Limits for tasks assessed                                          Exercises Total Joint Exercises Ankle Circles/Pumps: AROM, Both, 15 reps, Supine Quad Sets: AROM, Both, Supine, 10 reps Heel Slides: AAROM, Right, Supine, 15 reps Straight Leg Raises: AAROM, Right, Supine, 15 reps Goniometric ROM: AAROM R knee -5 - 30 pain limited    General Comments        Pertinent Vitals/Pain Pain Assessment Pain Assessment: 0-10 Pain Score: 8  Pain Location: R knee with activity Pain Descriptors / Indicators: Aching, Grimacing, Sore Pain Intervention(s): Limited activity within patient's tolerance, Monitored during session, Premedicated before session (pt declines ice - states it makes it hurt more)    Home Living                          Prior Function            PT  Goals (current goals can now be found in the care plan section) Acute Rehab PT Goals Patient Stated Goal: REgain IND PT Goal Formulation: With patient Time For Goal Achievement: 01/15/23 Potential to Achieve Goals: Good Progress towards PT goals: Progressing toward goals    Frequency    7X/week      PT Plan      Co-evaluation              AM-PAC PT "6 Clicks" Mobility   Outcome Measure  Help needed turning from your back to your side while in a flat bed without using bedrails?: A Lot Help needed moving from lying on your back to sitting on the side of a flat bed without using bedrails?: A Lot Help needed moving to and from a bed to a chair (including a wheelchair)?: A Little Help needed standing up from a chair using your  arms (e.g., wheelchair or bedside chair)?: A Little Help needed to walk in hospital room?: A Little Help needed climbing 3-5 steps with a railing? : A Lot 6 Click Score: 15    End of Session Equipment Utilized During Treatment: Gait belt Activity Tolerance: Patient limited by pain Patient left: in chair;with call bell/phone within reach;with chair alarm set Nurse Communication: Mobility status PT Visit Diagnosis: Difficulty in walking, not elsewhere classified (R26.2)     Time: 2706-2376 PT Time Calculation (min) (ACUTE ONLY): 35 min  Charges:    $Gait Training: 8-22 mins $Therapeutic Exercise: 8-22 mins PT General Charges $$ ACUTE PT VISIT: 1 Visit                     Mauro Kaufmann PT Acute Rehabilitation Services Pager [phone] Office [phone]    Christina Fuller 01/03/2023, 12:27 PM

## 2023-01-03 NOTE — Progress Notes (Signed)
Physical Therapy Treatment Patient Details Name: Christina Fuller MRN: [id number] DOB: 02-22-1966 Today's Date: 01/03/2023   History of Present Illness Pt s/p R TKR revision and with hx of bil TKR, colon CA, PAF and lumbar fusion.    PT Comments  Pt continues cooperative and with noted increased level of alertness this pm.  Pt up to ambulate increased distance in hall with noted improvement in ease of movement and balance.  Pt declines to attempt stairs this pm.  Pt hopes for dc home tomorrow.    If plan is discharge home, recommend the following: A little help with bathing/dressing/bathroom;Assistance with cooking/housework;Assist for transportation;Help with stairs or ramp for entrance;A little help with walking and/or transfers   Can travel by private vehicle        Equipment Recommendations  None recommended by PT    Recommendations for Other Services       Precautions / Restrictions Precautions Precautions: Knee;Fall Precaution Booklet Issued: No Restrictions Weight Bearing Restrictions: No Other Position/Activity Restrictions: WBAT     Mobility  Bed Mobility               General bed mobility comments: UP in chair and requests to stay in same    Transfers Overall transfer level: Needs assistance Equipment used: Rolling walker (2 wheels) Transfers: Sit to/from Stand Sit to Stand: Min assist           General transfer comment: cues for LE management and use of UEs to self assist    Ambulation/Gait Ambulation/Gait assistance: Min assist, Contact guard assist Gait Distance (Feet): 72 Feet Assistive device: Rolling walker (2 wheels) Gait Pattern/deviations: Step-to pattern, Decreased step length - left, Decreased step length - right, Shuffle, Trunk flexed Gait velocity: decr     General Gait Details: INcreased time with cues for posture, sequence and position from [employer]             Wheelchair Mobility     Tilt Bed    Modified  Rankin (Stroke Patients Only)       Balance Overall balance assessment: Needs assistance Sitting-balance support: No upper extremity supported, Feet supported Sitting balance-Leahy Scale: Good     Standing balance support: Single extremity supported Standing balance-Leahy Scale: Fair                              Cognition Arousal: Alert Behavior During Therapy: WFL for tasks assessed/performed Overall Cognitive Status: Within Functional Limits for tasks assessed                                          Exercises Total Joint Exercises Ankle Circles/Pumps: AROM, Both, 15 reps, Supine Quad Sets: AROM, Both, Supine, 10 reps Heel Slides: AAROM, Right, Supine, 15 reps Straight Leg Raises: AAROM, Right, Supine, 15 reps Goniometric ROM: AAROM R knee -5 - 30 pain limited    General Comments        Pertinent Vitals/Pain Pain Assessment Pain Assessment: 0-10 Pain Score: 8  Pain Location: R knee with activity Pain Descriptors / Indicators: Aching, Grimacing, Sore Pain Intervention(s): Limited activity within patient's tolerance, Monitored during session, Premedicated before session, Ice applied    Home Living  Prior Function            PT Goals (current goals can now be found in the care plan section) Acute Rehab PT Goals Patient Stated Goal: REgain IND PT Goal Formulation: With patient Time For Goal Achievement: 01/15/23 Potential to Achieve Goals: Good Progress towards PT goals: Progressing toward goals    Frequency    7X/week      PT Plan      Co-evaluation              AM-PAC PT "6 Clicks" Mobility   Outcome Measure  Help needed turning from your back to your side while in a flat bed without using bedrails?: A Lot Help needed moving from lying on your back to sitting on the side of a flat bed without using bedrails?: A Lot Help needed moving to and from a bed to a chair (including a  wheelchair)?: A Little Help needed standing up from a chair using your arms (e.g., wheelchair or bedside chair)?: A Little Help needed to walk in hospital room?: A Little Help needed climbing 3-5 steps with a railing? : A Lot 6 Click Score: 15    End of Session Equipment Utilized During Treatment: Gait belt Activity Tolerance: Patient tolerated treatment well Patient left: in chair;with call bell/phone within reach;with chair alarm set Nurse Communication: Mobility status PT Visit Diagnosis: Difficulty in walking, not elsewhere classified (R26.2)     Time: 0865-7846 PT Time Calculation (min) (ACUTE ONLY): 18 min  Charges:    $Gait Training: 8-22 mins PT General Charges $$ ACUTE PT VISIT: 1 Visit                     Mauro Kaufmann PT Acute Rehabilitation Services Pager [phone] Office [phone]    Christina Fuller 01/03/2023, 4:10 PM

## 2023-01-03 NOTE — Plan of Care (Signed)
  Problem: Activity: Goal: Ability to avoid complications of mobility impairment will improve Outcome: Progressing Goal: Range of joint motion will improve Outcome: Adequate for Discharge   Problem: Clinical Measurements: Goal: Postoperative complications will be avoided or minimized Outcome: Progressing   Problem: Pain Management: Goal: Pain level will decrease with appropriate interventions Outcome: Adequate for Discharge   Problem: Health Behavior/Discharge Planning: Goal: Ability to manage health-related needs will improve Outcome: Adequate for Discharge   Problem: Clinical Measurements: Goal: Ability to maintain clinical measurements within normal limits will improve Outcome: Progressing Goal: Will remain free from infection Outcome: Progressing Goal: Diagnostic test results will improve Outcome: Progressing Goal: Respiratory complications will improve Outcome: Progressing   Problem: Activity: Goal: Risk for activity intolerance will decrease Outcome: Adequate for Discharge   Problem: Nutrition: Goal: Adequate nutrition will be maintained Outcome: Completed/Met   Problem: Elimination: Goal: Will not experience complications related to bowel motility Outcome: Progressing   Problem: Pain Managment: Goal: General experience of comfort will improve Outcome: Progressing   Problem: Safety: Goal: Ability to remain free from injury will improve Outcome: Progressing

## 2023-01-03 NOTE — Progress Notes (Signed)
Subjective: 3 Days Post-Op Procedure(s) (LRB): TOTAL KNEE REVISION (Right)  Patient reports pain as appropriately controlled. Denies any new numbness/tingling.   Objective:   VITALS:  Temp:  [98.2 F (36.8 C)-99.5 F (37.5 C)] 99.5 F (37.5 C) (10/06 0455) Pulse Rate:  [68-80] 80 (10/06 0455) Resp:  [18] 18 (10/06 0455) BP: (124-139)/(64-70) 139/64 (10/06 0455) SpO2:  [100 %] 100 % (10/06 0455)  Neurologically intact ABD soft Neurovascular intact Sensation intact distally Intact pulses distally Dorsiflexion/Plantar flexion intact Incision: dressing C/D/I and no drainage No cellulitis present Compartment soft No sign of DVT   LABS Recent Labs    01/01/23 0314 01/02/23 0334  HGB 9.6* 9.1*  WBC 7.6 7.0  PLT 234 229   Recent Labs    01/01/23 0314 01/02/23 0334  NA 136 134*  K 4.1 3.6  CL 106 103  CO2 21* 24  BUN 8 9  CREATININE 0.87 0.94  GLUCOSE 115* 105*   No results for input(s): "LABPT", "INR" in the last 72 hours.   Assessment/Plan: 3 Days Post-Op Procedure(s) (LRB): TOTAL KNEE REVISION (Right)  Advance diet Up with therapy D/C IV fluids Drain continue until <30cc per shift Continue to watch intra-op cx Continue Eliquis for DVT ppx Will work on pain control - added gabapentin. Could otherwise consider switch from OxyIR to PO dilaudid. Anticipate D/C tomorrow as long as pain improved, passing PT, and drain output decreases  Christina Fuller 01/03/2023, 7:48 AM

## 2023-01-04 ENCOUNTER — Encounter (HOSPITAL_COMMUNITY): Payer: Self-pay | Admitting: Orthopedic Surgery

## 2023-01-04 LAB — BASIC METABOLIC PANEL
Anion gap: 7 (ref 5–15)
BUN: 9 mg/dL (ref 6–20)
CO2: 23 mmol/L (ref 22–32)
Calcium: 8 mg/dL — ABNORMAL LOW (ref 8.9–10.3)
Chloride: 105 mmol/L (ref 98–111)
Creatinine, Ser: 0.85 mg/dL (ref 0.44–1.00)
GFR, Estimated: >60 mL/min (ref >60)
Glucose, Bld: 127 mg/dL — ABNORMAL HIGH (ref 70–99)
Potassium: 3.6 mmol/L (ref 3.5–5.1)
Sodium: 135 mmol/L (ref 135–145)

## 2023-01-04 LAB — CBC
HCT: 23.7 % — ABNORMAL LOW (ref 36.0–46.0)
Hemoglobin: 8 g/dL — ABNORMAL LOW (ref 12.0–15.0)
MCH: 28.9 pg (ref 26.0–34.0)
MCHC: 33.8 g/dL (ref 30.0–36.0)
MCV: 85.6 fL (ref 80.0–100.0)
Platelets: 228 10*3/uL (ref 150–400)
RBC: 2.77 MIL/uL — ABNORMAL LOW (ref 3.87–5.11)
RDW: 12.6 % (ref 11.5–15.5)
WBC: 6.1 10*3/uL (ref 4.0–10.5)
nRBC: 0 % (ref 0.0–0.2)

## 2023-01-04 MED ORDER — METHOCARBAMOL 500 MG PO TABS: 500.0000 mg | ORAL_TABLET | Freq: Four times a day (QID) | ORAL | 0 refills | Status: AC | PRN

## 2023-01-04 MED ORDER — OXYCODONE HCL 5 MG PO TABS
5.0000 mg | ORAL_TABLET | ORAL | 0 refills | Status: AC | PRN
Start: 2023-01-04 — End: ?

## 2023-01-04 MED ORDER — DOCUSATE SODIUM 100 MG PO CAPS: 100.0000 mg | ORAL_CAPSULE | Freq: Two times a day (BID) | ORAL | 0 refills | Status: AC

## 2023-01-04 MED ORDER — POLYETHYLENE GLYCOL 3350 17 G PO PACK: 17.0000 g | PACK | Freq: Every day | ORAL | 0 refills | Status: AC | PRN

## 2023-01-04 MED ORDER — ONDANSETRON HCL 4 MG PO TABS: 4.0000 mg | ORAL_TABLET | Freq: Three times a day (TID) | ORAL | 0 refills | Status: AC | PRN

## 2023-01-04 MED ORDER — SENNA 8.6 MG PO TABS
2.0000 | ORAL_TABLET | Freq: Every day | ORAL | 0 refills | Status: AC
Start: 2023-01-04 — End: 2023-01-19

## 2023-01-04 NOTE — Progress Notes (Signed)
Physical Therapy Treatment Patient Details Name: Christina Fuller MRN: [id number] DOB: [birth year] Today's Date: 01/04/2023   History of Present Illness Pt s/p R TKR revision and with hx of bil TKR, colon CA, PAF and lumbar fusion.    PT Comments  POD # 4 am session Pt OOB in recliner.  Assisted with amb in hallway with R platform walker (due to recent wrist surgery) a functional distance of 65 feet.  Pt requesting pain meds before attempting stairs.  Pain meds requested.  Will return to practice stairs later.      If plan is discharge home, recommend the following: A little help with bathing/dressing/bathroom;Assistance with cooking/housework;Assist for transportation;Help with stairs or ramp for entrance;A little help with walking and/or transfers   Can travel by private vehicle        Equipment Recommendations  None recommended by PT    Recommendations for Other Services       Precautions / Restrictions Precautions Precautions: Knee;Fall Precaution Comments: no pillow under knee Restrictions Weight Bearing Restrictions: No Other Position/Activity Restrictions: WBAT     Mobility  Bed Mobility               General bed mobility comments: UP in chair and requests to stay in same    Transfers Overall transfer level: Needs assistance Equipment used: Rolling walker (2 wheels), Right platform walker Transfers: Sit to/from Stand Sit to Stand: Supervision, Contact guard assist           General transfer comment: cues for LE management and use of UEs to self assist    Ambulation/Gait Ambulation/Gait assistance: Supervision, Contact guard assist Gait Distance (Feet): 65 Feet Assistive device: Rolling walker (2 wheels), Right platform walker Gait Pattern/deviations: Step-to pattern, Decreased step length - left, Decreased step length - right, Shuffle, Trunk flexed Gait velocity: decr     General Gait Details: INcreased time with cues for posture, sequence and  position from [employer]             Wheelchair Mobility     Tilt Bed    Modified Rankin (Stroke Patients Only)       Balance                                            Cognition Arousal: Alert Behavior During Therapy: WFL for tasks assessed/performed Overall Cognitive Status: Within Functional Limits for tasks assessed                                 General Comments: AxO x 3 pleasant        Exercises      General Comments        Pertinent Vitals/Pain Pain Assessment Pain Assessment: 0-10 Pain Score: 5  Pain Location: R knee with activity Pain Descriptors / Indicators: Aching, Grimacing, Sore, Operative site guarding Pain Intervention(s): Monitored during session, Premedicated before session, Repositioned, Ice applied    Home Living                          Prior Function            PT Goals (current goals can now be found in the care plan section) Progress towards PT goals: Progressing toward goals    Frequency  7X/week      PT Plan      Co-evaluation              AM-PAC PT "6 Clicks" Mobility   Outcome Measure  Help needed turning from your back to your side while in a flat bed without using bedrails?: A Little Help needed moving from lying on your back to sitting on the side of a flat bed without using bedrails?: A Little Help needed moving to and from a bed to a chair (including a wheelchair)?: A Little Help needed standing up from a chair using your arms (e.g., wheelchair or bedside chair)?: A Little Help needed to walk in hospital room?: A Little Help needed climbing 3-5 steps with a railing? : A Little 6 Click Score: 18    End of Session Equipment Utilized During Treatment: Gait belt Activity Tolerance: Patient tolerated treatment well Patient left: in chair;with call bell/phone within reach;with chair alarm set Nurse Communication: Mobility status PT Visit Diagnosis:  Difficulty in walking, not elsewhere classified (R26.2)     Time: 0943-1000 PT Time Calculation (min) (ACUTE ONLY): 17 min  Charges:    $Gait Training: 8-22 mins PT General Charges $$ ACUTE PT VISIT: 1 Visit                    Felecia Shelling  PTA Acute  Rehabilitation Services Office M-F          [phone]

## 2023-01-04 NOTE — Progress Notes (Signed)
    Subjective:  Patient reports pain as mild to moderate.  Denies N/V/CP/SOB/Abd pain. She denies any tingling or numbness in LE bilaterally. She reports she has been having some pain but doing okay on the medication. She is eager for d/c home.   Objective:   VITALS:   Vitals:   01/03/23 0455 01/03/23 1351 01/03/23 2102 01/04/23 0453  BP: 139/64 136/64 124/63 130/65  Pulse: 80 75 79 78  Resp: 18 16 17 17   Temp: 99.5 F (37.5 C) 99.3 F (37.4 C) 99.6 F (37.6 C) 98.8 F (37.1 C)  TempSrc: Oral Oral Oral Oral  SpO2: 100% 100% 98% 98%  Weight:      Height:        Patient is sitting up in bed. NAD.  Neurologically intact ABD soft Neurovascular intact Sensation intact distally Intact pulses distally Dorsiflexion/Plantar flexion intact Incision: dressing C/D/I No cellulitis present Compartment soft Hemovac drain site, C/D/I. Hemovac drain removed today. Dry dressing placed over drain site.   Lab Results  Component Value Date   WBC 6.1 01/04/2023   HGB 8.0 (L) 01/04/2023   HCT 23.7 (L) 01/04/2023   MCV 85.6 01/04/2023   PLT 228 01/04/2023   BMET    Component Value Date/Time   NA 135 01/04/2023 0815   K 3.6 01/04/2023 0815   CL 105 01/04/2023 0815   CO2 23 01/04/2023 0815   GLUCOSE 127 (H) 01/04/2023 0815   BUN 9 01/04/2023 0815   CREATININE 0.85 01/04/2023 0815   CALCIUM 8.0 (L) 01/04/2023 0815   GFRNONAA >60 01/04/2023 0815   Results for orders placed or performed during the hospital encounter of 12/31/22  Aerobic/Anaerobic Culture w Gram Stain (surgical/deep wound)     Status: None (Preliminary result)   Collection Time: 12/31/22  9:14 AM   Specimen: Synovial, Right Knee; Body Fluid  Result Value Ref Range Status   Specimen Description   Final    KNEE RIGHT  FEMORAL INTERFACE MEMBRANE FOR TISSUE CULTURES Performed at [facility], [address] [place], [place] [address]    Special Requests   Final    NONE Performed at [facility], [address], [place], [place] [address]    Gram Stain NO WBC SEEN NO ORGANISMS SEEN   Final   Culture   Final    NO GROWTH 4 DAYS NO ANAEROBES ISOLATED; CULTURE IN PROGRESS FOR 5 DAYS Performed at [facility], [address], [place], [place] [address]    Report Status PENDING  Incomplete     Assessment/Plan: 4 Days Post-Op   Principal Problem:   Failed total right knee replacement (HCC) Active Problems:   S/P revision of total knee, right  ABLA. Hemoglobin 8.0. Asymptomatic. Continue to monitor.   WBAT with walker DVT ppx:  Eliquis , SCDs, TEDS PO pain control PT/OT: Patient ambulated 64 and 72 feet yesterday. Stair training today. PT to come by today.  Dispo:  - Hemovac drain removed, dry dressing placed.  - D/c home once cleared with PT.    Clois Dupes, PA-C 01/04/2023, 11:32 AM   [facility]  [place] [address], [address], [place], [place] [address] Phone: [phone] [email or web address] Facebook  [employer]

## 2023-01-04 NOTE — Progress Notes (Signed)
Physical Therapy Treatment Patient Details Name: Christina Fuller MRN: [id number] DOB: [birth year] Today's Date: 01/04/2023   History of Present Illness Pt s/p R TKR revision and with hx of bil TKR, colon CA, PAF and lumbar fusion.    PT Comments  POD # 4 pm session Assisted with amb in hallway, practice stairs then performed a few TE's following HEP handout. Addressed all mobility questions, discussed appropriate activity, educated on use of ICE.  Pt ready for D/C to home.    If plan is discharge home, recommend the following: A little help with bathing/dressing/bathroom;Assistance with cooking/housework;Assist for transportation;Help with stairs or ramp for entrance;A little help with walking and/or transfers   Can travel by private vehicle        Equipment Recommendations  None recommended by PT    Recommendations for Other Services       Precautions / Restrictions Precautions Precautions: Knee;Fall Precaution Comments: no pillow under knee Restrictions Weight Bearing Restrictions: No Other Position/Activity Restrictions: WBAT     Mobility  Bed Mobility               General bed mobility comments: UP in chair and requests to stay in same    Transfers Overall transfer level: Needs assistance Equipment used: Rolling walker (2 wheels), Right platform walker Transfers: Sit to/from Stand Sit to Stand: Supervision, Contact guard assist           General transfer comment: cues for LE management and use of UEs to self assist    Ambulation/Gait Ambulation/Gait assistance: Supervision, Contact guard assist Gait Distance (Feet): 55 Feet Assistive device: Rolling walker (2 wheels), Right platform walker Gait Pattern/deviations: Step-to pattern, Decreased step length - left, Decreased step length - right, Shuffle, Trunk flexed Gait velocity: decr     General Gait Details: INcreased time with cues for posture, sequence and position from RW   Stairs Stairs:  Yes Stairs assistance: Supervision, Contact guard assist Stair Management: Two rails, Step to pattern, Forwards Number of Stairs: 2 General stair comments: VC's on proper sequencing as well as allowing pt to lean forearm on R rail.  Pt performed well.   Wheelchair Mobility     Tilt Bed    Modified Rankin (Stroke Patients Only)       Balance                                            Cognition Arousal: Alert Behavior During Therapy: WFL for tasks assessed/performed Overall Cognitive Status: Within Functional Limits for tasks assessed                                 General Comments: AxO x 3 pleasant        Exercises  Total Knee Replacement TE's following HEP handout 10 reps B LE ankle pumps 05 reps towel squeezes 05 reps knee presses 05 reps heel slides  05 reps SAQ's 05 reps SLR's 05 reps ABD Educated on use of gait belt to assist with TE's Followed by ICE     General Comments        Pertinent Vitals/Pain Pain Assessment Pain Assessment: 0-10 Pain Score: 3  Pain Location: R knee with activity Pain Descriptors / Indicators: Aching, Grimacing, Sore, Operative site guarding Pain Intervention(s): Monitored during session, Premedicated before session, Repositioned, Ice applied  Home Living                          Prior Function            PT Goals (current goals can now be found in the care plan section) Progress towards PT goals: Progressing toward goals    Frequency    7X/week      PT Plan      Co-evaluation              AM-PAC PT "6 Clicks" Mobility   Outcome Measure  Help needed turning from your back to your side while in a flat bed without using bedrails?: A Little Help needed moving from lying on your back to sitting on the side of a flat bed without using bedrails?: A Little Help needed moving to and from a bed to a chair (including a wheelchair)?: A Little Help needed standing up  from a chair using your arms (e.g., wheelchair or bedside chair)?: A Little Help needed to walk in hospital room?: A Little Help needed climbing 3-5 steps with a railing? : A Little 6 Click Score: 18    End of Session Equipment Utilized During Treatment: Gait belt Activity Tolerance: Patient tolerated treatment well Patient left: in chair;with call bell/phone within reach;with chair alarm set Nurse Communication: Mobility status PT Visit Diagnosis: Difficulty in walking, not elsewhere classified (R26.2)     Time: 1110-1135 PT Time Calculation (min) (ACUTE ONLY): 25 min  Charges:    $Gait Training: 8-22 mins $Therapeutic Exercise: 8-22 mins PT General Charges $$ ACUTE PT VISIT: 1 Visit                    Felecia Shelling  PTA Acute  Rehabilitation Services Office M-F          [phone]

## 2023-01-05 ENCOUNTER — Encounter (HOSPITAL_COMMUNITY): Payer: Self-pay | Admitting: Orthopedic Surgery

## 2023-01-05 LAB — AEROBIC/ANAEROBIC CULTURE W GRAM STAIN (SURGICAL/DEEP WOUND)
Culture: NO GROWTH
Gram Stain: NONE SEEN

## 2023-01-06 NOTE — Discharge Summary (Signed)
Physician Discharge Summary  Patient ID: Christina Fuller MRN: [id number] DOB/AGE: [birth year] 59 y.o.  Admit date: 12/31/2022 Discharge date: 01/04/2023  Admission Diagnoses:  Failed total right knee replacement [facility])  Discharge Diagnoses:  Principal Problem:   Failed total right knee replacement (HCC) Active Problems:   S/P revision of total knee, right   Past Medical History:  Diagnosis Date   Arthritis    Carpal tunnel syndrome    Colon cancer (HCC) 2004   Dysrhythmia    GERD (gastroesophageal reflux disease)    H/O hiatal hernia    Herniated nucleus pulposus    Hypertension    Paroxysmal atrial fibrillation (HCC)    evaluated by [facility]     Surgeries: Procedure(s): TOTAL KNEE REVISION on 12/31/2022   Consultants (if any):   Discharged Condition: Improved  Hospital Course: Christina Fuller is an 57 y.o. female who was admitted 12/31/2022 with a diagnosis of Failed total right knee replacement (HCC) and went to the operating room on 12/31/2022 and underwent the above named procedures.    She was given perioperative antibiotics:  Anti-infectives (From admission, onward)    Start     Dose/Rate Route Frequency Ordered Stop   12/31/22 1800  ceFAZolin (ANCEF) IVPB 2g/100 mL premix        2 g 200 mL/hr over 30 Minutes Intravenous Every 6 hours 12/31/22 1655 01/01/23 0014   12/31/22 0630  ceFAZolin (ANCEF) IVPB 2g/100 mL premix        2 g 200 mL/hr over 30 Minutes Intravenous On call to O.R. 12/31/22 5277 12/31/22 8242       She was given sequential compression devices, early ambulation, and Eliquis for DVT prophylaxis.  POD#1 She had some difficulty with pain control. Hemovac output 280 cc bloody fluid. Drain continue. ABLA. Hemoglobin 9.6. Intraoperative cultures pending, gram stain negative. Slow progression with PT due to pain.  POD#2 Difficulty with pain control. Gabapentin added for additional pain control. Hemovac drain continued due to output. Slow  progression with PT, she ambulated 32 feet.  POD#3. Hemovac drain continued. Pain improved today. She ambulated 62 feet with PT. Intraoperative cultures still negative.  POD#4 Pain improved. Hemovac drain removed. She ambulated 65 feet with PT and passed stair training. D/c home with OPPT.   She benefited maximally from the hospital stay and there were no complications.    Recent vital signs:  Vitals:   01/03/23 2102 01/04/23 0453  BP: 124/63 130/65  Pulse: 79 78  Resp: 17 17  Temp: 99.6 F (37.6 C) 98.8 F (37.1 C)  SpO2: 98% 98%    Recent laboratory studies:  Lab Results  Component Value Date   HGB 8.0 (L) 01/04/2023   HGB 9.1 (L) 01/02/2023   HGB 9.6 (L) 01/01/2023   Lab Results  Component Value Date   WBC 6.1 01/04/2023   PLT 228 01/04/2023   Lab Results  Component Value Date   INR 1.0 04/20/2019   Lab Results  Component Value Date   NA 135 01/04/2023   K 3.6 01/04/2023   CL 105 01/04/2023   CO2 23 01/04/2023   BUN 9 01/04/2023   CREATININE 0.85 01/04/2023   GLUCOSE 127 (H) 01/04/2023     Allergies as of 01/04/2023   No Known Allergies      Medication List     TAKE these medications    atenolol 25 MG tablet Commonly known as: TENORMIN Take 25 mg by mouth daily.   bisacodyl 5 MG  EC tablet Commonly known as: DULCOLAX Take 5 mg by mouth daily as needed for moderate constipation.   docusate sodium 100 MG capsule Commonly known as: Colace Take 1 capsule (100 mg total) by mouth 2 (two) times daily.   Eliquis 5 MG Tabs tablet Generic drug: apixaban Take 5 mg by mouth 2 (two) times daily.   ferrous sulfate 325 (65 FE) MG EC tablet Take 325 mg by mouth daily.   methocarbamol 500 MG tablet Commonly known as: ROBAXIN Take 1 tablet (500 mg total) by mouth every 6 (six) hours as needed for muscle spasms.   multivitamin with minerals Tabs tablet Take 1 tablet by mouth daily.   ondansetron 4 MG tablet Commonly known as: Zofran Take 1 tablet (4  mg total) by mouth every 8 (eight) hours as needed for nausea or vomiting.   oxyCODONE 5 MG immediate release tablet Commonly known as: Roxicodone Take 1-2 tablets (5-10 mg total) by mouth every 4 (four) hours as needed for severe pain or moderate pain.   polyethylene glycol 17 g packet Commonly known as: MiraLax Take 17 g by mouth daily as needed for mild constipation or moderate constipation.   senna 8.6 MG Tabs tablet Commonly known as: SENOKOT Take 2 tablets (17.2 mg total) by mouth at bedtime for 15 days.   topiramate 50 MG tablet Commonly known as: TOPAMAX Take 50 mg by mouth daily.   Zepbound 7.5 MG/0.5ML Pen Generic drug: tirzepatide Inject 7.5 mg into the skin once a week.               Discharge Care Instructions  (From admission, onward)           Start     Ordered   01/04/23 0000  Weight bearing as tolerated        01/04/23 1144   01/04/23 0000  Change dressing       Comments: Do not remove your dressing.   01/04/23 1144              WEIGHT BEARING   Weight bearing as tolerated with assist device (walker, cane, etc) as directed, use it as long as suggested by your surgeon or therapist, typically at least 4-6 weeks.   EXERCISES  Results after joint replacement surgery are often greatly improved when you follow the exercise, range of motion and muscle strengthening exercises prescribed by your doctor. Safety measures are also important to protect the joint from further injury. Any time any of these exercises cause you to have increased pain or swelling, decrease what you are doing until you are comfortable again and then slowly increase them. If you have problems or questions, call your caregiver or physical therapist for advice.   Rehabilitation is important following a joint replacement. After just a few days of immobilization, the muscles of the leg can become weakened and shrink (atrophy).  These exercises are designed to build up the tone and  strength of the thigh and leg muscles and to improve motion. Often times heat used for twenty to thirty minutes before working out will loosen up your tissues and help with improving the range of motion but do not use heat for the first two weeks following surgery (sometimes heat can increase post-operative swelling).   These exercises can be done on a training (exercise) mat, on the floor, on a table or on a bed. Use whatever works the best and is most comfortable for you.    Use music or television while  you are exercising so that the exercises are a pleasant break in your day. This will make your life better with the exercises acting as a break in your routine that you can look forward to.   Perform all exercises about fifteen times, three times per day or as directed.  You should exercise both the operative leg and the other leg as well.  Exercises include:   Quad Sets - Tighten up the muscle on the front of the thigh (Quad) and hold for 5-10 seconds.   Straight Leg Raises - With your knee straight (if you were given a brace, keep it on), lift the leg to 60 degrees, hold for 3 seconds, and slowly lower the leg.  Perform this exercise against resistance later as your leg gets stronger.  Leg Slides: Lying on your back, slowly slide your foot toward your buttocks, bending your knee up off the floor (only go as far as is comfortable). Then slowly slide your foot back down until your leg is flat on the floor again.  Angel Wings: Lying on your back spread your legs to the side as far apart as you can without causing discomfort.  Hamstring Strength:  Lying on your back, push your heel against the floor with your leg straight by tightening up the muscles of your buttocks.  Repeat, but this time bend your knee to a comfortable angle, and push your heel against the floor.  You may put a pillow under the heel to make it more comfortable if necessary.   A rehabilitation program following joint replacement  surgery can speed recovery and prevent re-injury in the future due to weakened muscles. Contact your doctor or a physical therapist for more information on knee rehabilitation.    CONSTIPATION  Constipation is defined medically as fewer than three stools per week and severe constipation as less than one stool per week.  Even if you have a regular bowel pattern at home, your normal regimen is likely to be disrupted due to multiple reasons following surgery.  Combination of anesthesia, postoperative narcotics, change in appetite and fluid intake all can affect your bowels.   YOU MUST use at least one of the following options; they are listed in order of increasing strength to get the job done.  They are all available over the counter, and you may need to use some, POSSIBLY even all of these options:    Drink plenty of fluids (prune juice may be helpful) and high fiber foods Colace 100 mg by mouth twice a day  Senokot for constipation as directed and as needed Dulcolax (bisacodyl), take with full glass of water  Miralax (polyethylene glycol) once or twice a day as needed.  If you have tried all these things and are unable to have a bowel movement in the first 3-4 days after surgery call either your surgeon or your primary doctor.    If you experience loose stools or diarrhea, hold the medications until you stool forms back up.  If your symptoms do not get better within 1 week or if they get worse, check with your doctor.  If you experience "the worst abdominal pain ever" or develop nausea or vomiting, please contact the office immediately for further recommendations for treatment.   ITCHING:  If you experience itching with your medications, try taking only a single pain pill, or even half a pain pill at a time.  You can also use Benadryl over the counter for itching or also to help  with sleep.   TED HOSE STOCKINGS:  Use stockings on both legs until for at least 2 weeks or as directed by physician  office. They may be removed at night for sleeping.  MEDICATIONS:  See your medication summary on the "After Visit Summary" that nursing will review with you.  You may have some home medications which will be placed on hold until you complete the course of blood thinner medication.  It is important for you to complete the blood thinner medication as prescribed.  PRECAUTIONS:  If you experience chest pain or shortness of breath - call 911 immediately for transfer to the hospital emergency department.   If you develop a fever greater that 101 F, purulent drainage from wound, increased redness or drainage from wound, foul odor from the wound/dressing, or calf pain - CONTACT YOUR SURGEON.                                                   FOLLOW-UP APPOINTMENTS:  If you do not already have a post-op appointment, please call the office for an appointment to be seen by your surgeon.  Guidelines for how soon to be seen are listed in your "After Visit Summary", but are typically between 1-4 weeks after surgery.  OTHER INSTRUCTIONS:   Knee Replacement:  Do not place pillow under knee, focus on keeping the knee straight while resting. CPM instructions: 0-90 degrees, 2 hours in the morning, 2 hours in the afternoon, and 2 hours in the evening. Place foam block, curve side up under heel at all times except when in CPM or when walking.  DO NOT modify, tear, cut, or change the foam block in any way.   MAKE SURE YOU:  Understand these instructions.  Get help right away if you are not doing well or get worse.    Thank you for letting us be a part of your medical care team.  It is a privilege we respect greatly.  We hope these instructions will help you stay on track for a fast and full recovery!   Diagnostic Studies: DG Knee Right Port  Result Date: 12/31/2022 CLINICAL DATA:  Status post right knee arthroplasty. EXAM: PORTABLE RIGHT KNEE - 1-2 VIEW COMPARISON:  Right knee radiographs 06/19/2021 FINDINGS:  Interval removal of the prior total right knee arthroplasty hardware and placement of new moderately long femoral and tibial stem tiny arthroplasty hardware. No perihardware lucency is seen to indicate hardware failure or loosening. Expected postoperative changes are seen including mild intra-articular and subcutaneous air anterior surgical skin staples. A surgical drain severe approach terminates over the superior anterolateral knee. IMPRESSION: Status post right total knee arthroplasty without evidence of hardware failure or loosening. Electronically Signed   By: Neita Garnet M.D.   On: 12/31/2022 14:30    Disposition: Discharge disposition: 01-Home or Self Care       Discharge Instructions     Call MD / Call 911   Complete by: As directed    If you experience chest pain or shortness of breath, CALL 911 and be transported to the hospital emergency room.  If you develope a fever above 101 F, pus (white drainage) or increased drainage or redness at the wound, or calf pain, call your surgeon's office.   Change dressing   Complete by: As directed    Do not  remove your dressing.   Constipation Prevention   Complete by: As directed    Drink plenty of fluids.  Prune juice may be helpful.  You may use a stool softener, such as Colace (over the counter) 100 mg twice a day.  Use MiraLax (over the counter) for constipation as needed.   Diet - low sodium heart healthy   Complete by: As directed    Discharge instructions   Complete by: As directed    Elevate toes above nose. Use cryotherapy as needed for pain and swelling.   Do not put a pillow under the knee. Place it under the heel.   Complete by: As directed    Driving restrictions   Complete by: As directed    No driving for 6 weeks   Increase activity slowly as tolerated   Complete by: As directed    Lifting restrictions   Complete by: As directed    No lifting for 6 weeks   Post-operative opioid taper instructions:   Complete by: As  directed    POST-OPERATIVE OPIOID TAPER INSTRUCTIONS: It is important to wean off of your opioid medication as soon as possible. If you do not need pain medication after your surgery it is ok to stop day one. Opioids include: Codeine, Hydrocodone(Norco, Vicodin), Oxycodone(Percocet, oxycontin) and hydromorphone amongst others.  Long term and even short term use of opiods can cause: Increased pain response Dependence Constipation Depression Respiratory depression And more.  Withdrawal symptoms can include Flu like symptoms Nausea, vomiting And more Techniques to manage these symptoms Hydrate well Eat regular healthy meals Stay active Use relaxation techniques(deep breathing, meditating, yoga) Do Not substitute Alcohol to help with tapering If you have been on opioids for less than two weeks and do not have pain than it is ok to stop all together.  Plan to wean off of opioids This plan should start within one week post op of your joint replacement. Maintain the same interval or time between taking each dose and first decrease the dose.  Cut the total daily intake of opioids by one tablet each day Next start to increase the time between doses. The last dose that should be eliminated is the evening dose.      TED hose   Complete by: As directed    Use stockings (TED hose) for 2 weeks on both leg(s).  You may remove them at night for sleeping.   Weight bearing as tolerated   Complete by: As directed         Follow-up Information     Clois Dupes, PA-C. Schedule an appointment as soon as possible for a visit in 2 week(s).   Specialty: Orthopedic Surgery Why: For suture removal, For wound re-check Contact information: [address] [place] [address] [phone]                  Signed: Clois Dupes 01/06/2023, 2:18 PM
